# Patient Record
Sex: Male | Born: 1940 | Race: White | Hispanic: No | Smoking: Former smoker
Health system: Southern US, Community
[De-identification: ages and names within clinical notes are randomized; demographics above are authoritative.]

## PROBLEM LIST (undated history)

## (undated) DIAGNOSIS — N189 Chronic kidney disease, unspecified: Secondary | ICD-10-CM

## (undated) DIAGNOSIS — I10 Essential (primary) hypertension: Secondary | ICD-10-CM

## (undated) DIAGNOSIS — F32A Depression, unspecified: Secondary | ICD-10-CM

## (undated) DIAGNOSIS — C187 Malignant neoplasm of sigmoid colon: Secondary | ICD-10-CM

## (undated) DIAGNOSIS — C4491 Basal cell carcinoma of skin, unspecified: Secondary | ICD-10-CM

## (undated) DIAGNOSIS — G4733 Obstructive sleep apnea (adult) (pediatric): Secondary | ICD-10-CM

## (undated) DIAGNOSIS — I509 Heart failure, unspecified: Secondary | ICD-10-CM

## (undated) DIAGNOSIS — J189 Pneumonia, unspecified organism: Secondary | ICD-10-CM

## (undated) DIAGNOSIS — F329 Major depressive disorder, single episode, unspecified: Secondary | ICD-10-CM

## (undated) DIAGNOSIS — J439 Emphysema, unspecified: Secondary | ICD-10-CM

## (undated) DIAGNOSIS — I4891 Unspecified atrial fibrillation: Secondary | ICD-10-CM

## (undated) DIAGNOSIS — I2510 Atherosclerotic heart disease of native coronary artery without angina pectoris: Secondary | ICD-10-CM

## (undated) DIAGNOSIS — I5030 Unspecified diastolic (congestive) heart failure: Secondary | ICD-10-CM

## (undated) DIAGNOSIS — J449 Chronic obstructive pulmonary disease, unspecified: Secondary | ICD-10-CM

## (undated) DIAGNOSIS — M1990 Unspecified osteoarthritis, unspecified site: Secondary | ICD-10-CM

## (undated) DIAGNOSIS — C3490 Malignant neoplasm of unspecified part of unspecified bronchus or lung: Secondary | ICD-10-CM

## (undated) DIAGNOSIS — H539 Unspecified visual disturbance: Secondary | ICD-10-CM

## (undated) HISTORY — DX: Malignant neoplasm of unspecified part of unspecified bronchus or lung: C34.90

## (undated) HISTORY — PX: CATARACT EXTRACTION W/ INTRAOCULAR LENS  IMPLANT, BILATERAL: SHX1307

## (undated) HISTORY — PX: COLONOSCOPY: SHX174

## (undated) HISTORY — DX: Pneumonia, unspecified organism: J18.9

## (undated) HISTORY — PX: REPLACEMENT TOTAL KNEE BILATERAL: SUR1225

## (undated) HISTORY — DX: Obstructive sleep apnea (adult) (pediatric): G47.33

## (undated) HISTORY — DX: Chronic kidney disease, unspecified: N18.9

## (undated) HISTORY — DX: Malignant neoplasm of sigmoid colon: C18.7

## (undated) HISTORY — PX: LUNG CANCER SURGERY: SHX702

## (undated) HISTORY — DX: Essential (primary) hypertension: I10

## (undated) HISTORY — DX: Basal cell carcinoma of skin, unspecified: C44.91

## (undated) HISTORY — DX: Atherosclerotic heart disease of native coronary artery without angina pectoris: I25.10

## (undated) HISTORY — DX: Chronic obstructive pulmonary disease, unspecified: J44.9

## (undated) HISTORY — DX: Depression, unspecified: F32.A

## (undated) HISTORY — DX: Heart failure, unspecified: I50.9

## (undated) HISTORY — DX: Unspecified osteoarthritis, unspecified site: M19.90

## (undated) HISTORY — DX: Unspecified diastolic (congestive) heart failure: I50.30

## (undated) HISTORY — DX: Emphysema, unspecified: J43.9

## (undated) HISTORY — DX: Unspecified atrial fibrillation: I48.91

## (undated) HISTORY — PX: TOTAL HIP ARTHROPLASTY: SHX124

## (undated) HISTORY — DX: Major depressive disorder, single episode, unspecified: F32.9

## (undated) HISTORY — DX: Unspecified visual disturbance: H53.9

## (undated) HISTORY — PX: COLON SURGERY: SHX602

## (undated) HISTORY — PX: OTHER SURGICAL HISTORY: SHX169

---

## 2001-10-20 ENCOUNTER — Inpatient Hospital Stay (HOSPITAL_COMMUNITY): Admission: RE | Admit: 2001-10-20 | Discharge: 2001-10-23 | Payer: Self-pay | Admitting: Orthopedic Surgery

## 2001-10-20 ENCOUNTER — Encounter: Payer: Self-pay | Admitting: Orthopedic Surgery

## 2001-10-21 ENCOUNTER — Encounter: Payer: Self-pay | Admitting: Orthopedic Surgery

## 2002-02-18 ENCOUNTER — Encounter: Payer: Self-pay | Admitting: Orthopedic Surgery

## 2002-02-18 ENCOUNTER — Inpatient Hospital Stay (HOSPITAL_COMMUNITY): Admission: RE | Admit: 2002-02-18 | Discharge: 2002-02-21 | Payer: Self-pay | Admitting: Orthopedic Surgery

## 2006-05-14 DIAGNOSIS — C187 Malignant neoplasm of sigmoid colon: Secondary | ICD-10-CM

## 2006-05-14 DIAGNOSIS — C3490 Malignant neoplasm of unspecified part of unspecified bronchus or lung: Secondary | ICD-10-CM

## 2006-05-14 HISTORY — DX: Malignant neoplasm of sigmoid colon: C18.7

## 2006-05-14 HISTORY — DX: Malignant neoplasm of unspecified part of unspecified bronchus or lung: C34.90

## 2006-05-14 HISTORY — PX: LOW ANTERIOR BOWEL RESECTION: SUR1240

## 2006-05-14 HISTORY — PX: OTHER SURGICAL HISTORY: SHX169

## 2006-07-10 ENCOUNTER — Ambulatory Visit: Payer: Self-pay | Admitting: Gastroenterology

## 2006-07-25 ENCOUNTER — Encounter (INDEPENDENT_AMBULATORY_CARE_PROVIDER_SITE_OTHER): Payer: Self-pay | Admitting: Specialist

## 2006-07-25 ENCOUNTER — Ambulatory Visit: Payer: Self-pay | Admitting: Gastroenterology

## 2006-07-29 ENCOUNTER — Ambulatory Visit: Payer: Self-pay | Admitting: Internal Medicine

## 2006-08-02 ENCOUNTER — Encounter: Admission: RE | Admit: 2006-08-02 | Discharge: 2006-08-02 | Payer: Self-pay | Admitting: Surgery

## 2006-08-12 ENCOUNTER — Inpatient Hospital Stay (HOSPITAL_COMMUNITY): Admission: RE | Admit: 2006-08-12 | Discharge: 2006-08-18 | Payer: Self-pay | Admitting: Surgery

## 2006-08-12 ENCOUNTER — Encounter (INDEPENDENT_AMBULATORY_CARE_PROVIDER_SITE_OTHER): Payer: Self-pay | Admitting: Specialist

## 2006-08-12 ENCOUNTER — Ambulatory Visit: Payer: Self-pay | Admitting: Internal Medicine

## 2006-08-15 ENCOUNTER — Encounter: Payer: Self-pay | Admitting: Internal Medicine

## 2006-09-11 ENCOUNTER — Ambulatory Visit: Payer: Self-pay | Admitting: Internal Medicine

## 2006-10-14 ENCOUNTER — Ambulatory Visit: Payer: Self-pay

## 2007-01-14 ENCOUNTER — Encounter: Admission: RE | Admit: 2007-01-14 | Discharge: 2007-01-14 | Payer: Self-pay | Admitting: Surgery

## 2007-01-29 ENCOUNTER — Ambulatory Visit: Payer: Self-pay | Admitting: Thoracic Surgery

## 2007-02-04 ENCOUNTER — Ambulatory Visit (HOSPITAL_COMMUNITY): Admission: RE | Admit: 2007-02-04 | Discharge: 2007-02-04 | Payer: Self-pay | Admitting: Thoracic Surgery

## 2007-02-06 ENCOUNTER — Ambulatory Visit: Payer: Self-pay | Admitting: Thoracic Surgery

## 2007-02-11 ENCOUNTER — Ambulatory Visit: Admission: RE | Admit: 2007-02-11 | Discharge: 2007-05-12 | Payer: Self-pay | Admitting: Radiation Oncology

## 2007-02-25 ENCOUNTER — Ambulatory Visit (HOSPITAL_COMMUNITY): Admission: RE | Admit: 2007-02-25 | Discharge: 2007-02-25 | Payer: Self-pay | Admitting: Thoracic Surgery

## 2007-03-11 ENCOUNTER — Inpatient Hospital Stay (HOSPITAL_COMMUNITY): Admission: RE | Admit: 2007-03-11 | Discharge: 2007-03-16 | Payer: Self-pay | Admitting: Thoracic Surgery

## 2007-03-11 ENCOUNTER — Encounter: Payer: Self-pay | Admitting: Thoracic Surgery

## 2007-03-11 ENCOUNTER — Ambulatory Visit: Payer: Self-pay | Admitting: Thoracic Surgery

## 2007-03-26 ENCOUNTER — Ambulatory Visit: Payer: Self-pay | Admitting: Thoracic Surgery

## 2007-03-26 ENCOUNTER — Encounter: Admission: RE | Admit: 2007-03-26 | Discharge: 2007-03-26 | Payer: Self-pay | Admitting: Thoracic Surgery

## 2007-04-16 ENCOUNTER — Ambulatory Visit: Payer: Self-pay | Admitting: Thoracic Surgery

## 2007-04-16 ENCOUNTER — Encounter: Admission: RE | Admit: 2007-04-16 | Discharge: 2007-04-16 | Payer: Self-pay | Admitting: Thoracic Surgery

## 2007-05-30 ENCOUNTER — Ambulatory Visit: Admission: RE | Admit: 2007-05-30 | Discharge: 2007-08-28 | Payer: Self-pay | Admitting: Radiation Oncology

## 2007-06-18 ENCOUNTER — Ambulatory Visit: Payer: Self-pay | Admitting: Thoracic Surgery

## 2007-06-18 ENCOUNTER — Encounter: Admission: RE | Admit: 2007-06-18 | Discharge: 2007-06-18 | Payer: Self-pay | Admitting: Thoracic Surgery

## 2007-09-05 ENCOUNTER — Ambulatory Visit: Payer: Self-pay | Admitting: Gastroenterology

## 2007-09-17 ENCOUNTER — Ambulatory Visit: Payer: Self-pay | Admitting: Thoracic Surgery

## 2007-09-17 ENCOUNTER — Encounter: Admission: RE | Admit: 2007-09-17 | Discharge: 2007-09-17 | Payer: Self-pay | Admitting: Thoracic Surgery

## 2007-09-19 ENCOUNTER — Ambulatory Visit: Payer: Self-pay | Admitting: Gastroenterology

## 2007-09-19 ENCOUNTER — Encounter: Payer: Self-pay | Admitting: Gastroenterology

## 2007-09-22 ENCOUNTER — Encounter: Payer: Self-pay | Admitting: Gastroenterology

## 2008-03-24 ENCOUNTER — Ambulatory Visit: Payer: Self-pay | Admitting: Thoracic Surgery

## 2008-03-24 ENCOUNTER — Encounter: Admission: RE | Admit: 2008-03-24 | Discharge: 2008-03-24 | Payer: Self-pay | Admitting: Thoracic Surgery

## 2008-10-13 ENCOUNTER — Encounter: Admission: RE | Admit: 2008-10-13 | Discharge: 2008-10-13 | Payer: Self-pay | Admitting: Thoracic Surgery

## 2008-10-13 ENCOUNTER — Ambulatory Visit: Payer: Self-pay | Admitting: Thoracic Surgery

## 2009-03-22 ENCOUNTER — Encounter: Payer: Self-pay | Admitting: Cardiology

## 2009-04-01 DIAGNOSIS — I10 Essential (primary) hypertension: Secondary | ICD-10-CM | POA: Insufficient documentation

## 2009-04-01 DIAGNOSIS — Z8679 Personal history of other diseases of the circulatory system: Secondary | ICD-10-CM

## 2009-04-01 DIAGNOSIS — M1990 Unspecified osteoarthritis, unspecified site: Secondary | ICD-10-CM | POA: Insufficient documentation

## 2009-04-01 DIAGNOSIS — C189 Malignant neoplasm of colon, unspecified: Secondary | ICD-10-CM | POA: Insufficient documentation

## 2009-04-04 ENCOUNTER — Ambulatory Visit: Payer: Self-pay | Admitting: Internal Medicine

## 2009-04-04 DIAGNOSIS — IMO0001 Reserved for inherently not codable concepts without codable children: Secondary | ICD-10-CM

## 2009-04-04 DIAGNOSIS — Z85118 Personal history of other malignant neoplasm of bronchus and lung: Secondary | ICD-10-CM

## 2009-04-05 ENCOUNTER — Ambulatory Visit: Payer: Self-pay | Admitting: Cardiovascular Disease

## 2009-04-05 ENCOUNTER — Encounter: Payer: Self-pay | Admitting: Internal Medicine

## 2009-04-27 ENCOUNTER — Ambulatory Visit: Payer: Self-pay | Admitting: Thoracic Surgery

## 2009-04-27 ENCOUNTER — Encounter: Admission: RE | Admit: 2009-04-27 | Discharge: 2009-04-27 | Payer: Self-pay | Admitting: Thoracic Surgery

## 2009-05-03 ENCOUNTER — Telehealth: Payer: Self-pay | Admitting: Internal Medicine

## 2009-05-04 ENCOUNTER — Encounter: Payer: Self-pay | Admitting: Cardiology

## 2009-05-19 ENCOUNTER — Inpatient Hospital Stay (HOSPITAL_COMMUNITY): Admission: RE | Admit: 2009-05-19 | Discharge: 2009-05-25 | Payer: Self-pay | Admitting: Orthopedic Surgery

## 2009-09-16 ENCOUNTER — Encounter: Payer: Self-pay | Admitting: Cardiology

## 2009-09-20 ENCOUNTER — Ambulatory Visit: Payer: Self-pay | Admitting: Cardiology

## 2009-09-20 DIAGNOSIS — R0989 Other specified symptoms and signs involving the circulatory and respiratory systems: Secondary | ICD-10-CM

## 2009-09-20 DIAGNOSIS — R0609 Other forms of dyspnea: Secondary | ICD-10-CM

## 2009-09-20 DIAGNOSIS — I5032 Chronic diastolic (congestive) heart failure: Secondary | ICD-10-CM

## 2009-09-20 DIAGNOSIS — I2510 Atherosclerotic heart disease of native coronary artery without angina pectoris: Secondary | ICD-10-CM

## 2009-09-21 LAB — CONVERTED CEMR LAB: Pro B Natriuretic peptide (BNP): 281 pg/mL — ABNORMAL HIGH (ref 0.0–100.0)

## 2009-10-04 ENCOUNTER — Ambulatory Visit: Payer: Self-pay | Admitting: Cardiology

## 2009-10-05 LAB — CONVERTED CEMR LAB
CO2: 27 meq/L (ref 19–32)
Calcium: 9.1 mg/dL (ref 8.4–10.5)
Chloride: 101 meq/L (ref 96–112)
GFR calc non Af Amer: 49.51 mL/min (ref >60)
Potassium: 4.2 meq/L (ref 3.5–5.1)

## 2009-10-06 ENCOUNTER — Telehealth (INDEPENDENT_AMBULATORY_CARE_PROVIDER_SITE_OTHER): Payer: Self-pay | Admitting: *Deleted

## 2009-10-11 ENCOUNTER — Ambulatory Visit: Payer: Self-pay | Admitting: Cardiology

## 2009-10-11 ENCOUNTER — Ambulatory Visit (HOSPITAL_COMMUNITY): Admission: RE | Admit: 2009-10-11 | Discharge: 2009-10-11 | Payer: Self-pay | Admitting: Cardiology

## 2009-10-11 ENCOUNTER — Ambulatory Visit: Payer: Self-pay

## 2009-10-11 ENCOUNTER — Encounter (HOSPITAL_COMMUNITY): Admission: RE | Admit: 2009-10-11 | Discharge: 2009-12-14 | Payer: Self-pay | Admitting: Cardiology

## 2009-10-11 ENCOUNTER — Encounter: Payer: Self-pay | Admitting: Cardiology

## 2009-10-12 DIAGNOSIS — J189 Pneumonia, unspecified organism: Secondary | ICD-10-CM

## 2009-10-12 HISTORY — DX: Pneumonia, unspecified organism: J18.9

## 2009-10-14 ENCOUNTER — Telehealth: Payer: Self-pay | Admitting: Cardiology

## 2009-10-17 ENCOUNTER — Ambulatory Visit: Payer: Self-pay | Admitting: Diagnostic Radiology

## 2009-10-17 ENCOUNTER — Inpatient Hospital Stay (HOSPITAL_COMMUNITY): Admission: EM | Admit: 2009-10-17 | Discharge: 2009-10-24 | Payer: Self-pay | Admitting: Internal Medicine

## 2009-10-17 ENCOUNTER — Ambulatory Visit: Payer: Self-pay | Admitting: Cardiology

## 2009-10-17 ENCOUNTER — Encounter: Payer: Self-pay | Admitting: Emergency Medicine

## 2009-10-21 ENCOUNTER — Encounter: Payer: Self-pay | Admitting: Cardiology

## 2009-10-22 ENCOUNTER — Encounter: Payer: Self-pay | Admitting: Cardiovascular Disease

## 2009-10-26 ENCOUNTER — Ambulatory Visit: Payer: Self-pay | Admitting: Thoracic Surgery

## 2009-11-02 ENCOUNTER — Ambulatory Visit: Payer: Self-pay | Admitting: Cardiology

## 2009-11-03 ENCOUNTER — Encounter: Payer: Self-pay | Admitting: Cardiology

## 2009-11-03 LAB — CONVERTED CEMR LAB
BUN: 25 mg/dL — ABNORMAL HIGH (ref 6–23)
Calcium: 8.8 mg/dL (ref 8.4–10.5)
Chloride: 105 meq/L (ref 96–112)
Glucose, Bld: 157 mg/dL — ABNORMAL HIGH (ref 70–99)
Pro B Natriuretic peptide (BNP): 259.4 pg/mL — ABNORMAL HIGH (ref 0.0–100.0)
Sodium: 143 meq/L (ref 135–145)

## 2009-11-21 ENCOUNTER — Ambulatory Visit: Payer: Self-pay | Admitting: Cardiology

## 2009-11-24 ENCOUNTER — Ambulatory Visit: Payer: Self-pay | Admitting: Internal Medicine

## 2009-11-25 ENCOUNTER — Encounter: Payer: Self-pay | Admitting: Cardiology

## 2009-11-28 ENCOUNTER — Ambulatory Visit: Payer: Self-pay | Admitting: Cardiology

## 2009-12-01 ENCOUNTER — Ambulatory Visit: Payer: Self-pay | Admitting: Cardiology

## 2009-12-01 ENCOUNTER — Inpatient Hospital Stay (HOSPITAL_BASED_OUTPATIENT_CLINIC_OR_DEPARTMENT_OTHER): Admission: RE | Admit: 2009-12-01 | Discharge: 2009-12-01 | Payer: Self-pay | Admitting: Cardiology

## 2009-12-01 LAB — CONVERTED CEMR LAB
AST: 19 units/L (ref 0–37)
Alkaline Phosphatase: 105 units/L (ref 39–117)
BUN: 20 mg/dL (ref 6–23)
CO2: 32 meq/L (ref 19–32)
Calcium: 9.3 mg/dL (ref 8.4–10.5)
Chloride: 106 meq/L (ref 96–112)
Cholesterol: 200 mg/dL (ref 0–200)
Creatinine, Ser: 1.4 mg/dL (ref 0.4–1.5)
Eosinophils Relative: 1.5 % (ref 0.0–5.0)
GFR calc non Af Amer: 53.59 mL/min (ref >60)
Glucose, Bld: 124 mg/dL — ABNORMAL HIGH (ref 70–99)
INR: 1 (ref 0.8–1.0)
LDL Cholesterol: 142 mg/dL — ABNORMAL HIGH (ref 0–99)
Lymphocytes Relative: 21.3 % (ref 12.0–46.0)
MCV: 90.5 fL (ref 78.0–100.0)
Monocytes Relative: 9.5 % (ref 3.0–12.0)
Neutrophils Relative %: 67.2 % (ref 43.0–77.0)
Potassium: 4.7 meq/L (ref 3.5–5.1)
RBC: 4.5 M/uL (ref 4.22–5.81)
RDW: 15.5 % — ABNORMAL HIGH (ref 11.5–14.6)
Sodium: 142 meq/L (ref 135–145)
Total CHOL/HDL Ratio: 5

## 2010-01-05 ENCOUNTER — Ambulatory Visit: Payer: Self-pay | Admitting: Cardiology

## 2010-01-05 ENCOUNTER — Telehealth (INDEPENDENT_AMBULATORY_CARE_PROVIDER_SITE_OTHER): Payer: Self-pay | Admitting: *Deleted

## 2010-02-09 ENCOUNTER — Ambulatory Visit: Payer: Self-pay | Admitting: Pulmonary Disease

## 2010-04-18 ENCOUNTER — Ambulatory Visit: Payer: Self-pay | Admitting: Cardiology

## 2010-04-21 ENCOUNTER — Ambulatory Visit: Payer: Self-pay | Admitting: Pulmonary Disease

## 2010-04-24 ENCOUNTER — Ambulatory Visit: Payer: Self-pay | Admitting: Pulmonary Disease

## 2010-04-24 DIAGNOSIS — G4733 Obstructive sleep apnea (adult) (pediatric): Secondary | ICD-10-CM | POA: Insufficient documentation

## 2010-04-25 ENCOUNTER — Encounter
Admission: RE | Admit: 2010-04-25 | Discharge: 2010-04-25 | Payer: Self-pay | Source: Home / Self Care | Attending: Thoracic Surgery | Admitting: Thoracic Surgery

## 2010-04-25 ENCOUNTER — Ambulatory Visit: Payer: Self-pay | Admitting: Thoracic Surgery

## 2010-06-04 ENCOUNTER — Encounter: Payer: Self-pay | Admitting: Thoracic Surgery

## 2010-06-13 NOTE — Progress Notes (Signed)
Summary: Test results   Phone Note Call from Patient Call back at [phone]   Caller: Patient Summary of Call: Test results Initial call taken by: Judie Grieve,  October 14, 2009 1:31 PM  Follow-up for Phone Call        talked with pt by telephone about echo and myoview 10/11/09

## 2010-06-13 NOTE — Progress Notes (Signed)
Summary: switching doctors  Phone Note Call from Patient Call back at [facility] Phone [phone]   Caller: Patient Call For: wert Reason for Call: Talk to Nurse Summary of Call: pt would like to switch his pulmonary doctor to [facility] from [place].  Hsays he has seen both doctors although I only she Wert.  Dr. Shirlee Latch from Cardiology is referring pt over for COPD. Initial call taken by: Eugene Gavia,  January 05, 2010 11:32 AM  Follow-up for Phone Call        pt last saw [facility] 04/04/2009.  Pt wanting to switch from [facility] to [facility].  Will forward this message to both Dr's to address.  Aundra Millet Reynolds LPN  January 05, 2010 11:41 AM  He probably saw Nyssa in hospital, I only saw him once for preop eval so I'm fine with him going back to [place] - I don't recall any issues  Follow-up by: Nyoka Cowden MD,  January 05, 2010 2:09 PM  Additional Follow-up for Phone Call Additional follow up Details #1::        Spoke with pt and sched consult with Dr Craige Cotta for 02/02/10 at 2:45 pm. Additional Follow-up by: Vernie Murders,  January 05, 2010 2:16 PM

## 2010-06-13 NOTE — Consult Note (Signed)
Summary: [facility]   Imported By: Roderic Ovens 11/04/2009 15:17:41  _____________________________________________________________________  External Attachment:    Type:   Image     Comment:   External Document

## 2010-06-13 NOTE — Miscellaneous (Signed)
Summary: Orders Update pft charges  Clinical Lists Changes  Orders: Added new Service order of Carbon Monoxide diffusing w/capacity (94720) - Signed Added new Service order of Lung Volumes (94240) - Signed Added new Service order of Spirometry (Pre & Post) (94060) - Signed 

## 2010-06-13 NOTE — Assessment & Plan Note (Signed)
Summary: rov/sl      Allergies Added: NKDA  Visit Type:  Follow-up Referring Provider:  Dr Herb Grays Primary Provider:  Dr. Herb Grays  CC:  SOB.  History of Present Illness: 70 yo with history of paroxysmal atrial fibrillation, diastolic CHF, COPD, lung and colon cancers, and HTN returns for evaluation of dyspnea.  Patient had revision left TKR in 1/11.  He had post-op volume overload/pulmonary edema resolved with Lasix.  He has had significant chronic shortness of breath that has been present for at least a year.  At last appointment, he was short of breath after walking about 50 yards or pushing his garbage to the road.  He had atypical chest pain.  Patient was started on Lasix and Spiriva.   I set him up for myoview and echo.  Lexiscan myoview showed EF 45% with lateral hypokinesis and apical thinning but no definite ischemia or infarction.  Echo showed EF 55% with mild LV hypertrophy, mild diastolic dysfunction, and normal RV size and function.    After this, patient was admitted to the hospital with fever/chills/cough and was found to have right upper lobe PNA.  In the hospital, he developed atrial fibrillation with rapid ventricular response.  He actually converted back to NSR while on a diltiazem gtt.  He was started on amiodarone and dabigatran and discharged home.  Troponin was noted to be mildly elevated (0.11) in the setting of afib/RVR.  Given history of significant exertional dyspnea, it was thought that he should get a left and right heart cath after recovery from PNA. Patient is in sinus rhythm today.  He continues to have exertional dyspnea (pushing shopping carts at work at [employer] and climbing a flight of steps.  No chest pain.  No more orthostatic symptoms.   ECG: NSR,  old inferior MI  Labs (1/11): K 3.6, creatinine 1.4, TSH normal, BNP 419 Labs (5/11): BNP 281=>132, K 4.2, creatinine 1.4 Labs (6/11): K 4.9, creatinine 1.4, BNP 259, maximal troponin in hospital  0.11  Current Medications (verified): 1)  Meclizine Hcl 25 Mg Tabs (Meclizine Hcl) .... Take 1 Tablet By Mouth Once A Day 2)  Paxil 40 Mg Tabs (Paroxetine Hcl) .... Take 1 Tablet By Mouth Once A Day 3)  Aricept 10 Mg Tabs (Donepezil Hcl) .... Take 1 Tablet By Mouth Once A Day 4)  Depo-Testosterone 200 Mg/ml Oil (Testosterone Cypionate) .... Every 2 Weeks 5)  Furosemide 20 Mg Tabs (Furosemide) .... One Tablet Daily 6)  Klor-Con 10 10 Meq Cr-Tabs (Potassium Chloride) .... One Tablet Daily 7)  Spiriva Handihaler 18 Mcg Caps (Tiotropium Bromide Monohydrate) .... Use Daily As Directed 8)  Proair Hfa 108 (90 Base) Mcg/act Aers (Albuterol Sulfate) .... Use As Directed 9)  Aspirin 81 Mg Tbec (Aspirin) .... Take One Tablet By Mouth Daily 10)  Pradaxa 150 Mg Caps (Dabigatran Etexilate Mesylate) .... Take 1 By Mouth Two Times A Day 11)  Omeprazole 20 Mg Cpdr (Omeprazole) .... Take 1 By Mouth Once Daily 12)  Coreg 12.5 Mg Tabs (Carvedilol) .... One Tablet Twice A Day 13)  Amiodarone Hcl 200 Mg Tabs (Amiodarone Hcl) .... Take One Tablet By Mouth Daily 14)  Hydrocodone-Acetaminophen 5-325 Mg Tabs (Hydrocodone-Acetaminophen) .... Take As Directed As Needed 15)  Diltiazem Hcl Cr 180 Mg Xr24h-Cap (Diltiazem Hcl) .Marland Kitchen.. 1 Once Daily  Allergies (verified): No Known Drug Allergies  Past History:  Past Medical History: Reviewed history from 11/02/2009 and no changes required. 1. CARCINOMA, LUNG, HX OF (ICD-V10.11)     -  LUL wedge resection and seed implant 02/2007     - CT neg recurrence 10/13/08 2. COLON CANCER (ICD-153.9): s/p resection in 2008.  3. OSTEOARTHRITIS (ICD-715.90)     - s/p Bilat. Hip and Knee replacements with redo R Knee replacement and redo left TKR. 4. HYPERTENSION (ICD-401.9) 5. ATRIAL FIBRILLATION, HX OF (ICD-V12.59): post-operative in 2008.  Had another episode in 6/11 in the setting of PNA.  He was started on dabigatran and on amiodarone to maintain NSR.  6. Adenosine myoview (6/08):  EF 56%, fixed inferior defect may have been attenuation.  No evidence for ischemia.  Lexiscan myoview (5/11): EF 45%, lateral hypokinesis, apical thinning with no evidence for ischemia or infarction.   7. COPD    - PFT's April 04, 2009  FEV1 33    and ratio 54    - HFA 50% April 04, 2009  8. CKD 9. ? Dementia (he is on Aricept) 10. CHF: Diastolic, echo (8/08) with EF 16%, moderate LVH.  Echo (5/11): EF 55%, mild LV hypertrophy, normal wall motion, mild diastolic dysfunction, mild MR, mild LAE, normal RV size and systolic function.   11.  RIght upper lobe PNA 6/11  Family History: Reviewed history from 04/04/2009 and no changes required. Colon CA- Sister and Father  Social History: Married, lives in [place] Former smoker.  Quit in 2008.  Smoked approx 40 yrs up to 3 ppd. Quit ETOH 2008 Working at [employer]  Review of Systems       All systems reviewed and negative except as per HPI.   Vital Signs:  Patient profile:   70 year old male Height:      72 inches Weight:      226 pounds BMI:     30.76 Pulse rate:   77 / minute Pulse rhythm:   regular BP sitting:   142 / 84  (left arm) Cuff size:   regular  Vitals Entered By: Hardin Negus, RMA (November 21, 2009 8:35 AM)  Physical Exam  General:  Well developed, well nourished, in no acute distress.  Obese.  Neck:  Neck supple, JVP 8 cm. No masses, thyromegaly or abnormal cervical nodes. Lungs:  Mildly decreased breath sounds bilaterally.  Heart:  Non-displaced PMI, chest non-tender; regular rate and rhythm, S1, S2 without murmurs, rubs or gallops. Carotid upstroke normal, no bruit.  Pedals normal pulses. Trace ankle edema.  Abdomen:  Bowel sounds positive; abdomen soft and non-tender without masses, organomegaly, or hernias noted. No hepatosplenomegaly. Extremities:  No clubbing or cyanosis. Neurologic:  Alert and oriented x 3. Psych:  Normal affect.   Impression & Recommendations:  Problem # 1:  CAD,  NATIVE VESSEL (ICD-414.01) Patient has significant exertional dyspnea probably due to a combination of diastolic CHF and COPD as well as atypical chest pain.  Dyspnea has improved somewhat with diuresis.  Troponin was elevated with atrial fibrillation/RVR while in the hospital.  Myoview showed decreased EF but no definite ischemia or infarction.  Plan at discharge from hospital was left and right heart cath after he has recovered from PNA.  I will have him stop Pradaxa week (has been on it 1 month post-atrial fibrillation episode) and will do a left and right heart cath when he has been off the medication for 3 full days.  Will check fasting lipids with labs this week.   Problem # 2:  DYSPNEA ON EXERTION (ICD-786.09) Likely a mixed picture of diastolic CHF (? ischemic) and COPD.  He is breathing  better with Lasix and Spiriva.  He may be mildly overloaded on exam. Continue current Lasix.  Check BMET.  Will be getting right heart cath.   Problem # 3:  ATRIAL FIBRILLATION, HX OF (ICD-V12.59) Paroxysmal. Needs anticoagulation because of relatively high stroke risk.  He is now on amiodarone for maintenance  of NSR.  - Check TSH, LFTs, PFTs on amiodarone.  Will need yearly eye exam.  - Stop dabigatran for cath (3 full days prior), resume after cath.    Other Orders: EKG w/ Interpretation (93000) Cardiac Catheterization (Cardiac Cath) Pulmonary Function Test (PFT)  Patient Instructions: 1)  Your physician recommends that you return for a FASTING lipid profile/liver profile/CBC/BMP/INR/TSH --427.31 786.09--Monday July 18,2011 2)  Your physician has requested that you have a cardiac catheterization.  Cardiac catheterization is used to diagnose and/or treat various heart conditions. Doctors may recommend this procedure for a number of different reasons. The most common reason is to evaluate chest pain. Chest pain can be a symptom of coronary artery disease (CAD), and cardiac catheterization can show whether  plaque is narrowing or blocking your heart's arteries. This procedure is also used to evaluate the valves, as well as measure the blood flow and oxygen levels in different parts of your heart.  For further information please visit [email or web address]  Please follow instruction sheet, as given. 3)  Thursday July 21,2011 4)  Your physician has recommended that you have a pulmonary function test.  Pulmonary Function Tests are a group of tests that measure how well air moves in and out of your lungs. 5)  Your physician recommends that you schedule a follow-up appointment in: 3 weeks after the cath 12/01/09 with Dr Shirlee Latch.

## 2010-06-13 NOTE — Assessment & Plan Note (Addendum)
Summary: copd/former mw pt//lmr   Copy to:  Marca Ancona, Karle Plumber Primary Provider/Referring Provider:  Dr. Herb Grays  CC:  Patient is here for pulmonary evaluation...former Dr. Sherene Sires patient...the patient c/o increased sob with exertion...cough with occasional mucus production.  History of Present Illness: 70 yo male with GOLD 2 COPD.  He was previously seen by Dr. Sherene Sires.  He needs to re-establish pulmonary care.  He was hospitalized in June for right upper lobe pneumonia, but has improved from this.  He still has some trouble with his breathing.  He has been getting occasional shortness of breath.  He has a cough with clear sputum.  He denies hemoptysis, or recent fever.  He has been using spiriva and symbicort.  He was on advair before, felt that this worked better.  He has not been using his inhalers on a consistent basis.  He has trouble walking, but usually has to stop because of joint pains rather than because of his breathing.  He also feels like he lacks the energy to do things, and some of this may be related to feeling depressed.  He will initially feel like doing activities only to realize that he can't, and this causes frustration.   Preventive Screening-Counseling & Management  Alcohol-Tobacco     Alcohol drinks/day: 0     Smoking Status: quit     Packs/Day: 1.5     Year Started: 1950?     Year Quit: 1998  Current Medications (verified): 1)  Spiriva Handihaler 18 Mcg Caps (Tiotropium Bromide Monohydrate) .... Use Daily As Directed 2)  Proair Hfa 108 (90 Base) Mcg/act Aers (Albuterol Sulfate) .... Use As Directed 3)  Meclizine Hcl 25 Mg Tabs (Meclizine Hcl) .... Take 1 Tablet By Mouth Once A Day 4)  Aricept 10 Mg Tabs (Donepezil Hcl) .... Take 1 Tablet By Mouth Once A Day 5)  Depo-Testosterone 200 Mg/ml Oil (Testosterone Cypionate) .... Every 2 Weeks 6)  Furosemide 20 Mg Tabs (Furosemide) .... One Tablet Daily 7)  Klor-Con 10 10 Meq Cr-Tabs (Potassium Chloride)  .... One Tablet Daily 8)  Aspirin 81 Mg Tbec (Aspirin) .... Take One Tablet By Mouth Daily 9)  Pradaxa 150 Mg Caps (Dabigatran Etexilate Mesylate) .... Take 1 By Mouth Two Times A Day 10)  Omeprazole 20 Mg Cpdr (Omeprazole) .... Take 1 By Mouth Once Daily 11)  Bisoprolol Fumarate 5 Mg Tabs (Bisoprolol Fumarate) .... One Tablet Daily 12)  Amiodarone Hcl 200 Mg Tabs (Amiodarone Hcl) .... Take One Tablet By Mouth Daily 13)  Hydrocodone-Acetaminophen 5-325 Mg Tabs (Hydrocodone-Acetaminophen) .... Take As Directed As Needed 14)  Diltiazem Hcl Cr 180 Mg Xr24h-Cap (Diltiazem Hcl) .Marland Kitchen.. 1 Once Daily 15)  Zocor 20 Mg Tabs (Simvastatin) .... One in The Evening 16)  Fluoxetine Hcl 20 Mg Caps (Fluoxetine Hcl) .Marland Kitchen.. 1 By Mouth Daily 17)  Symbicort 160-4.5 Mcg/act Aero (Budesonide-Formoterol Fumarate) .... 2 Puffs Two Times A Day As Needed  Allergies (verified): No Known Drug Allergies  Past History:  Past Medical History: 1. 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 COLON CANCER (ICD-153.9): s/p resection in 2008.  3. OSTEOARTHRITIS (ICD-715.90)     - s/p Bilat. Hip and Knee replacements with redo R Knee replacement and redo left TKR. 4. HYPERTENSION (ICD-401.9) 5. ATRIAL FIBRILLATION, HX OF (ICD-V12.59): post-operative in 2008.  Had another episode in 6/11 in the setting of PNA.  He  was started on dabigatran and on amiodarone to maintain NSR.  6. CAD: Adenosine myoview (6/08): EF 56%, fixed inferior defect may have been attenuation.  No evidence for ischemia.  Lexiscan myoview (5/11): EF 45%, lateral hypokinesis, apical thinning with no evidence for ischemia or infarction.  Left heart cath (7/11) with EF 50%, mild global hypokinesis, 50% mid RCA, 40% PLV, 50% ostial ramus.  Moderate nonobstructive disease, medical management.  7. COPD: moderate    - PFT's 04/04/09: FEV1 33%, FEV1% 54    - PFTs 07/11: FEV1 50%, FEV1% 54, TLC  80%, DLCO 69% 8. CKD 9. ? Dementia (he is on Aricept) 10. CHF: Diastolic, echo (8/08) with EF 04%, moderate LVH.  Echo (5/11): EF 55%, mild LV hypertrophy, normal wall motion, mild diastolic dysfunction, mild MR, mild LAE, normal RV size and systolic function.  RHC (7/11): mean RA 7, PA 29/15, PCWP 8.  11.  RIght upper lobe PNA 6/11  Past Surgical History: Left upper lobe wedge resection 2008 Low anterior resection of sigmoid and proximal rectum 2008 Bilateral hip replacement  Bilateral knee replacement  Family History: Reviewed history from 04/04/2009 and no changes required. Colon CA- Sister and Father Family History Asthma---sister Family History Diabetes---2 sisters  Social History: Reviewed history from 11/21/2009 and no changes required. Married, lives in [place] Former smoker.  Quit in 2008.  Smoked approx 40 yrs up to 3 ppd. Quit ETOH 2008 Working at [employer] drinks/day:  0 Smoking Status:  quit Packs/Day:  1.5  Review of Systems       The patient complains of shortness of breath with activity.  The patient denies shortness of breath at rest, productive cough, non-productive cough, coughing up blood, chest pain, irregular heartbeats, acid heartburn, indigestion, loss of appetite, weight change, abdominal pain, difficulty swallowing, sore throat, tooth/dental problems, headaches, nasal congestion/difficulty breathing through nose, sneezing, itching, ear ache, anxiety, depression, hand/feet swelling, joint stiffness or pain, rash, change in color of mucus, and fever.    Vital Signs:  Patient profile:   70 year old male Height:      72 inches (182.88 cm) Weight:      233 pounds (105.91 kg) BMI:     31.71 O2 Sat:      96 % on Room air Temp:     98.3 degrees F (36.83 degrees C) oral Pulse rate:   74 / minute BP sitting:   108 / 68  (left arm) Cuff size:   regular  Vitals Entered By: Michel Bickers CMA (February 09, 2010 4:33 PM)  O2 Sat at Rest %:   96 O2 Flow:  Room air  Serial Vital Signs/Assessments:  Comments: Ambulatory Pulse Oximetry  Resting; HR__74___    02 Sat__96%RA___  Lap1 (185 feet)   HR__84___   02 Sat__97%RA___ Lap2 (185 feet)   HR_  85___   02 Sat__96%RA___    Lap3 (185 feet)   HR_____   02 Sat_____  ___Test Completed without Difficulty _x__Test Stopped due to: knee pain. Zackery Barefoot CMA  February 09, 2010 4:53 PM    By: Zackery Barefoot CMA   CC: Patient is here for pulmonary evaluation...former Dr. Sherene Sires patient...the patient c/o increased sob with exertion...cough with occasional mucus production Comments Medications reviewed with patient Michel Bickers [facility]  February 09, 2010 4:34 PM   Physical Exam  General:  obese.   Eyes:  PERRLA and EOMI.   Nose:  no deformity, discharge, inflammation, or lesions Mouth:  MP 3, no exudate  Neck:  no JVD.   Chest Wall:  no deformities noted Lungs:  diminished breath sounds, no wheezing or rales, no dullness Heart:  regular rhythm, normal rate, and no murmurs.   Abdomen:  obese, soft, nontender, normal bowel sounds Extremities:  trace leg edema, no cyanosis or clubbing Neurologic:  normal CN II-XII and strength normal.   Cervical Nodes:  no significant adenopathy Psych:  alert and cooperative; normal mood and affect; normal attention span and concentration   Impression & Recommendations:  Problem # 1:  COPD UNSPECIFIED (ICD-496) He has an extensive prior history of smoking.  He has evidence for airflow obstruction on pulmonary function testing.    I will adjust his inhaler regimen.  I will resume advair, and stop symbicort.  He is to continue spiriva and as needed albuterol.  Advised him to get his annual flu shot.  He would likely benefit from pulmonary rehab, but I am concerned that his arthritis may be a significant limiting factor at present with his exercise tolerance.  Will arrange for overnight oximetry.  Depending on the results he may need to use  supplemental oxygen at night, or he may need additional testing for sleep disordered breathing.  Of note is that he is on amiodarone, and I have recommended that he get annual pulmonary function testing while he is on this medication.  In addition, he may be experiencing some degree of depression.  I will defer to his primary physician for further assessment of this.  Medications Added to Medication List This Visit: 1)  Symbicort 160-4.5 Mcg/act Aero (Budesonide-formoterol fumarate) .... 2 puffs two times a day as needed 2)  Advair Diskus 250-50 Mcg/dose Aepb (Fluticasone-salmeterol) .... One puff two times a day  Complete Medication List: 1)  Spiriva Handihaler 18 Mcg Caps (Tiotropium bromide monohydrate) .... Use daily as directed 2)  Advair Diskus 250-50 Mcg/dose Aepb (Fluticasone-salmeterol) .... One puff two times a day 3)  Proair Hfa 108 (90 Base) Mcg/act Aers (Albuterol sulfate) .... Use as directed 4)  Meclizine Hcl 25 Mg Tabs (Meclizine hcl) .... Take 1 tablet by mouth once a day 5)  Aricept 10 Mg Tabs (Donepezil hcl) .... Take 1 tablet by mouth once a day 6)  Depo-testosterone 200 Mg/ml Oil (Testosterone cypionate) .... Every 2 weeks 7)  Furosemide 20 Mg Tabs (Furosemide) .... One tablet daily 8)  Klor-con 10 10 Meq Cr-tabs (Potassium chloride) .... One tablet daily 9)  Aspirin 81 Mg Tbec (Aspirin) .... Take one tablet by mouth daily 10)  Pradaxa 150 Mg Caps (Dabigatran etexilate mesylate) .... Take 1 by mouth two times a day 11)  Omeprazole 20 Mg Cpdr (Omeprazole) .... Take 1 by mouth once daily 12)  Bisoprolol Fumarate 5 Mg Tabs (Bisoprolol fumarate) .... One tablet daily 13)  Amiodarone Hcl 200 Mg Tabs (Amiodarone hcl) .... Take one tablet by mouth daily 14)  Hydrocodone-acetaminophen 5-325 Mg Tabs (Hydrocodone-acetaminophen) .... Take as directed as needed 15)  Diltiazem Hcl Cr 180 Mg Xr24h-cap (Diltiazem hcl) .Marland Kitchen.. 1 once daily 16)  Zocor 20 Mg Tabs (Simvastatin) .... One in  the evening 17)  Fluoxetine Hcl 20 Mg Caps (Fluoxetine hcl) .Marland Kitchen.. 1 by mouth daily  Other Orders: Est. Patient Level IV (72536) DME Referral (DME)  Patient Instructions: 1)  Stop symbicort 2)  Spiriva one puff once daily 3)  Advair one puff two times a day 4)  Proair two puffs up to four times per day as needed  5)  Will schedule oxygen test  at night 6)  Follow up in 2 to 3 months Prescriptions: PROAIR HFA 108 (90 BASE) MCG/ACT AERS (ALBUTEROL SULFATE) Use as directed  #1 x 6   Entered and Authorized by:   Coralyn Helling MD   Signed by:   Coralyn Helling MD on 02/09/2010   Method used:   Electronically to        [facility]  [address] [phone][facility] (retail)       [address] [place], [place]  [address]       Ph: [phone] or [phone]       Fax: [phone]   RxID:   [id number] SPIRIVA HANDIHALER 18 MCG CAPS (TIOTROPIUM BROMIDE MONOHYDRATE) use daily as directed  #1 x 6   Entered and Authorized by:   Coralyn Helling MD   Signed by:   Coralyn Helling MD on 02/09/2010   Method used:   Electronically to        [facility]  [address] [phone][facility] (retail)       [address] [place], [place]  [address]       Ph: [phone] or [phone]       Fax: [phone]   RxID:   [id number] ADVAIR DISKUS 250-50 MCG/DOSE AEPB (FLUTICASONE-SALMETEROL) one puff two times a day  #1 x 6   Entered and Authorized by:   Coralyn Helling MD   Signed by:   Coralyn Helling MD on 02/09/2010   Method used:   [facility]ronically to        [facility]  [address] [phone][facility] (retail)       [address] [place], [place]  [address]       Ph: [phone] or [phone]       Fax: [phone]   RxID:   [id number]   Appended Document: copd/former mw pt//lmr Discussed with patient his sleep pattern.  He has snoring, and reports trouble with his sleep.

## 2010-06-13 NOTE — Letter (Signed)
Summary: [facility] Office Note  [facility] Note   Imported By: Roderic Ovens 09/21/2009 16:28:58  _____________________________________________________________________  External Attachment:    Type:   Image     Comment:   External Document

## 2010-06-13 NOTE — Cardiovascular Report (Signed)
Summary: Pre Cath Orders   Pre Cath Orders   Imported By: Roderic Ovens 11/30/2009 10:26:56  _____________________________________________________________________  External Attachment:    Type:   Image     Comment:   External Document

## 2010-06-13 NOTE — Assessment & Plan Note (Signed)
Summary: Cardiology Nuclear Study**results to pt 10-14-09**  Nuclear Med Background Indications for Stress Test: Evaluation for Ischemia   History: COPD, Echo, Myocardial Perfusion Study  History Comments: '08 Echo: EF=60% '08 MPS: EF=56%, (-) ischemia, Inf. Thinning/Scar  Symptoms: Chest Pain, DOE, SOB    Nuclear Pre-Procedure Cardiac Risk Factors: History of Smoking, Hypertension Caffeine/Decaff Intake: None NPO After: 10:00 PM Lungs: clear IV 0.9% NS with Angio Cath: 18g     IV Site: (R) AC IV Started by: Stanton Kidney EMT-P Chest Size (in) 44     Height (in): 71 Weight (lb): 221 BMI: 30.93  Nuclear Med Study 1 or 2 day study:  1 day     Stress Test Type:  Eugenie Birks Reading MD:  Marca Ancona, MD     Referring MD:  D.Alison Breeding Resting Radionuclide:  Technetium 93m Tetrofosmin     Resting Radionuclide Dose:  11.0 mCi  Stress Radionuclide:  Technetium 3m Tetrofosmin     Stress Radionuclide Dose:  33.0 mCi   Stress Protocol   Lexiscan: 0.4 mg   Stress Test Technologist:  Milana Na EMT-P     Nuclear Technologist:  Domenic Polite CNMT  Rest Procedure  Myocardial perfusion imaging was performed at rest 45 minutes following the intravenous administration of Myoview Technetium 24m Tetrofosmin.  Stress Procedure  The patient received IV Lexiscan 0.4 mg over 15-seconds.  Myoview injected at 30-seconds.  There were no significant changes with infusion.  Quantitative spect images were obtained after a 45 minute delay.  QPS Raw Data Images:  Normal; no motion artifact; normal heart/lung ratio. Stress Images:  There is normal uptake in all areas. Rest Images:  Normal homogeneous uptake in all areas of the myocardium. Subtraction (SDS):  There is no evidence of scar or ischemia. Transient Ischemic Dilatation:  .98  (Normal <1.22)  Lung/Heart Ratio:  .31  (Normal <0.45)  Quantitative Gated Spect Images QGS EDV:  116 ml QGS ESV:  64 ml QGS EF:  45 % QGS cine images:   Lateral hypokinesis.    Overall Impression  Exercise Capacity: Lexiscan study BP Response: Normal blood pressure response. Clinical Symptoms: Short of breath and lightheaded.  ECG Impression: No significant ST segment change suggestive of ischemia. Overall Impression: Apical thinning with no evidence for ischemia or infarction.  Mild systolic dysfunction with lateral hypokinesis.   Appended Document: Cardiology Nuclear Study**results to pt 10-14-09** No ischemia by perfusion images but EF decreased.  Will need to discuss with patient, possibly may do cath if still symptomatic.  Will need to see back in the office soon.   Appended Document: Cardiology Nuclear Study appt 11-02-09 with Dr Shirlee Latch  Appended Document: Cardiology Nuclear Study**results to pt 10-14-09** discussed results with pt by telephone--pt states he is not having symptoms right now-he will keep appt with Dr Shirlee Latch 11-02-09

## 2010-06-13 NOTE — Assessment & Plan Note (Signed)
Summary: 3 week fu rs missed appt/mt  Medications Added PRADAXA 150 MG CAPS (DABIGATRAN ETEXILATE MESYLATE) Take 1 by mouth two times a day BISOPROLOL FUMARATE 5 MG TABS (BISOPROLOL FUMARATE) one tablet daily FLUOXETINE HCL 20 MG CAPS (FLUOXETINE HCL) 1 by mouth daily      Allergies Added: NKDA  Referring Provider:  Dr Herb Grays Primary Provider:  Dr. Herb Grays   History of Present Illness: 70 yo with history of paroxysmal atrial fibrillation, diastolic CHF, COPD, lung and colon cancers, and HTN returns for evaluation of dyspnea.  Patient had revision left TKR in 1/11.  He had post-op volume overload/pulmonary edema resolved with Lasix.  He has had significant chronic shortness of breath that has been present for at least a year.  He gets short of breath after walking < 1/4 mile or pushing his garbage to the road.  He had atypical chest pain.  Patient was started on Lasix and Spiriva.   I set him up for myoview and echo.  Lexiscan myoview showed EF 45% with lateral hypokinesis and apical thinning but no definite ischemia or infarction.  Echo showed EF 55% with mild LV hypertrophy, mild diastolic dysfunction, and normal RV size and function.    After this, patient was admitted to the hospital with fever/chills/cough and was found to have right upper lobe PNA.  In the hospital, he developed atrial fibrillation with rapid ventricular response.  He actually converted back to NSR while on a diltiazem gtt.  He was started on amiodarone and dabigatran and discharged home.  Troponin was noted to be mildly elevated (0.11) in the setting of afib/RVR.  Given history of significant exertional dyspnea, it was thought that he should get a left and right heart cath after recovery from PNA. Cath showed essentially normal right and left heart filling pressures and moderate nonobstructive coronary disease.  Plan for medical management.  PFTs in 7/11 showed moderate obstructive lung disease with borderline mild  restriction.    Patient additionally reports feeling sleepy and worn out all the time.  No energy.  He says that he had a sleep study about a year ago that did not show significant OSA.   Labs (1/11): K 3.6, creatinine 1.4, TSH normal, BNP 419 Labs (5/11): BNP 281=>132, K 4.2, creatinine 1.4 Labs (6/11): K 4.9, creatinine 1.4, BNP 259, maximal troponin in hospital 0.11 Labs (7/11): LDL 142, HDL 43, TSH normal, LFTs normal, creatinine 1.4, K 4.7  Current Medications (verified): 1)  Meclizine Hcl 25 Mg Tabs (Meclizine Hcl) .... Take 1 Tablet By Mouth Once A Day 2)  Aricept 10 Mg Tabs (Donepezil Hcl) .... Take 1 Tablet By Mouth Once A Day 3)  Depo-Testosterone 200 Mg/ml Oil (Testosterone Cypionate) .... Every 2 Weeks 4)  Furosemide 20 Mg Tabs (Furosemide) .... One Tablet Daily 5)  Klor-Con 10 10 Meq Cr-Tabs (Potassium Chloride) .... One Tablet Daily 6)  Spiriva Handihaler 18 Mcg Caps (Tiotropium Bromide Monohydrate) .... Use Daily As Directed 7)  Proair Hfa 108 (90 Base) Mcg/act Aers (Albuterol Sulfate) .... Use As Directed 8)  Aspirin 81 Mg Tbec (Aspirin) .... Take One Tablet By Mouth Daily 9)  Pradaxa 150 Mg Caps (Dabigatran Etexilate Mesylate) .... Take 1 By Mouth Two Times A Day==states He Takes 1 By Mouth Daily 10)  Omeprazole 20 Mg Cpdr (Omeprazole) .... Take 1 By Mouth Once Daily 11)  Coreg 12.5 Mg Tabs (Carvedilol) .... One Tablet Twice A Day 12)  Amiodarone Hcl 200 Mg Tabs (Amiodarone Hcl) .Marland KitchenMarland KitchenMarland Kitchen  Take One Tablet By Mouth Daily 13)  Hydrocodone-Acetaminophen 5-325 Mg Tabs (Hydrocodone-Acetaminophen) .... Take As Directed As Needed 14)  Diltiazem Hcl Cr 180 Mg Xr24h-Cap (Diltiazem Hcl) .Marland Kitchen.. 1 Once Daily 15)  Zocor 20 Mg Tabs (Simvastatin) .... One in The Evening 16)  Fluoxetine Hcl 20 Mg Caps (Fluoxetine Hcl) .Marland Kitchen.. 1 By Mouth Daily  Allergies (verified): No Known Drug Allergies  Past History:  Past Medical History: 1. 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 COLON CANCER (ICD-153.9): s/p resection in 2008.  3. OSTEOARTHRITIS (ICD-715.90)     - s/p Bilat. Hip and Knee replacements with redo R Knee replacement and redo left TKR. 4. HYPERTENSION (ICD-401.9) 5. ATRIAL FIBRILLATION, HX OF (ICD-V12.59): post-operative in 2008.  Had another episode in 6/11 in the setting of PNA.  He was started on dabigatran and on amiodarone to maintain NSR.  6. CAD: Adenosine myoview (6/08): EF 56%, fixed inferior defect may have been attenuation.  No evidence for ischemia.  Lexiscan myoview (5/11): EF 45%, lateral hypokinesis, apical thinning with no evidence for ischemia or infarction.  Left heart cath (7/11) with EF 50%, mild global hypokinesis, 50% mid RCA, 40% PLV, 50% ostial ramus.  Moderate nonobstructive disease, medical management.  7. COPD: moderate    - PFT's April 04, 2009  FEV1 33    and ratio 54    - HFA 50% April 04, 2009     - PFTs (7/11): FEV1 50%, FVC 65%, ratio 54%, TLC 80%, DLCO 69% 8. CKD 9. ? Dementia (he is on Aricept) 10. CHF: Diastolic, echo (8/08) with EF 16%, moderate LVH.  Echo (5/11): EF 55%, mild LV hypertrophy, normal wall motion, mild diastolic dysfunction, mild MR, mild LAE, normal RV size and systolic function.  RHC (7/11): mean RA 7, PA 29/15, PCWP 8.  11.  RIght upper lobe PNA 6/11  Family History: Reviewed history from 04/04/2009 and no changes required. Colon CA- Sister and Father  Social History: Reviewed history from 11/21/2009 and no changes required. Married, lives in [place] Children Former smoker.  Quit in 2008.  Smoked approx 40 yrs up to 3 ppd. Quit ETOH 2008 Working at [employer]  Review of Systems       All systems reviewed and negative except as per HPI.   Vital Signs:  Patient profile:   70 year old male Height:      72 inches Weight:      230 pounds BMI:     31.31 Pulse rate:   76 / minute Resp:     18 per minute BP sitting:   106 /  64  (left arm)  Vitals Entered By: Marrion Coy, CNA (January 05, 2010 10:16 AM)  Physical Exam  General:  Well developed, well nourished, in no acute distress.  Obese.  Neck:  Neck supple, no JVD. No masses, thyromegaly or abnormal cervical nodes. Lungs:  End expiratory wheezes. Heart:  Heart sounds distant.  Non-displaced PMI, chest non-tender; regular rate and rhythm, S1, S2 without murmurs, rubs or gallops. Carotid upstroke normal, no bruit.  Pedals normal pulses. Trace ankle edema.  Abdomen:  Bowel sounds positive; abdomen soft and non-tender without masses, organomegaly, or hernias noted. No hepatosplenomegaly. Extremities:  No clubbing or cyanosis. Neurologic:  Alert and oriented x 3. Psych:  Normal affect.   Impression & Recommendations:  Problem # 1:  CAD, NATIVE  VESSEL (ICD-414.01) Moderate nonobstuctive disease on cath in 7/11.  Continue ASA 81 mg daily, beta blocker, and statin.  Goal LDL < 70, need lipids/LFTs in 9/11.   Problem # 2:  DYSPNEA ON EXERTION (ICD-786.09) Likely a mixed picture of diastolic CHF and COPD.  He has been breathing better with Lasix and Spiriva.  He does not appear volume overloaded and filling pressures were well-controlled on Lasix on recent right heart cath.  Continue current Lasix.    Problem # 3:  COPD UNSPECIFIED (ICD-496) Moderate COPD.  some wheezing on exam.  He is using Spiriva.  I will have him followup with pulmonology (has seen pulmonary in the past) regarding COPD to optimize inhaler regimen.   - Patient was restarted on Coreg at some point, apparently in the hospital.  Given his moderate COPD and wheezing on exam, I am going to have him stop Coreg and start bisoprolol 5 mg daily (more beta-1 selective).   Problem # 4:  ATRIAL FIBRILLATION, HX OF (ICD-V12.59) Paroxysmal. Needs anticoagulation because of relatively high stroke risk.  He is now on amiodarone for maintenance  of NSR.  - Recent LFTs and PFTs on amiodarone were ok.  Needs  yearly eye exam.  - Continue anticoagulation with Pradaxa.   Other Orders: Pulmonary Referral (Pulmonary)  Patient Instructions: 1)  Your physician has recommended you make the following change in your medication:  2)  Take Pradaxa 150mg  twice a day. 3)  Stop Coreg(carvedilol). 4)  Start Bisoprolol 5mg  daily. 5)  Your physician recommends that you return for a FASTING lipid profile:/liver profile/BMP in September 2011--786.09 414.01 6)  You have been referred to pulmonary. 7)  See an optometrist for an eye exam because you take Amiodarone.  8)  Your physician recommends that you schedule a follow-up appointment in: 4 months with Dr Shirlee Latch. Prescriptions: BISOPROLOL FUMARATE 5 MG TABS (BISOPROLOL FUMARATE) one tablet daily  #30 x 11   Entered by:   Katina Dung, RN, BSN   Authorized by:   Marca Ancona, MD   Signed by:   Katina Dung, RN, BSN on 01/05/2010   Method used:   Electronically to        [facility]  [address] [phone]* (retail)       [address] [place], [place]  [address]       Ph: [phone] or [phone]       Fax: [phone]   RxID:   [id number] PRADAXA 150 MG CAPS (DABIGATRAN ETEXILATE MESYLATE) Take 1 by mouth two times a day  #60 x 11   Entered by:   Katina Dung, RN, BSN   Authorized by:   Marca Ancona, MD   Signed by:   Katina Dung, RN, BSN on 01/05/2010   Method used:   Electronically to        [facility]  [address] [phone]* (retail)       [address]       [place], [place]  [address]       Ph: [phone] or [phone]       Fax: [phone]   RxID:   [id number]

## 2010-06-13 NOTE — Assessment & Plan Note (Signed)
Summary: per check out  Medications Added ADVAIR DISKUS 250-50 MCG/DOSE AEPB (FLUTICASONE-SALMETEROL) one puff two times a day as needed SYMBICORT 80-4.5 MCG/ACT AERO (BUDESONIDE-FORMOTEROL FUMARATE) one to two puffs two times a day      Allergies Added: NKDA  Visit Type:  Follow-up Primary Provider:  Dr. Herb Grays  CC:  Pt. does c/o SOB but this is unchanged for him. No cp- dizziness- or edema..  History of Present Illness: 70 yo with history of paroxysmal atrial fibrillation, diastolic CHF, COPD, lung and colon cancers, moderate nonobstructive coronary disease, and HTN returns for evaluation of dyspnea.  He has been stable since last appointment.  No tachypalpitations to suggest recurrent atrial fibrillation.  He is in sinus rhythm today.  He is short of breath after walking about 1/4 mile.  He does not get short of breath walking up the ramp to his house.  No chest pain.  He is using both Advair and Symbicort.  Weight is down 3 lbs since last appointment.    Labs (1/11): K 3.6, creatinine 1.4, TSH normal, BNP 419 Labs (5/11): BNP 281=>132, K 4.2, creatinine 1.4 Labs (6/11): K 4.9, creatinine 1.4, BNP 259, maximal troponin in hospital 0.11 Labs (7/11): LDL 142, HDL 43, TSH normal, LFTs normal, creatinine 1.4, K 4.7  ECG: NSR, inferior Qs  Current Medications (verified): 1)  Spiriva Handihaler 18 Mcg Caps (Tiotropium Bromide Monohydrate) .... Use Daily As Directed 2)  Advair Diskus 250-50 Mcg/dose Aepb (Fluticasone-Salmeterol) .... One Puff Two Times A Day As Needed 3)  Symbicort 80-4.5 Mcg/act Aero (Budesonide-Formoterol Fumarate) .... One To Two Puffs Two Times A Day 4)  Meclizine Hcl 25 Mg Tabs (Meclizine Hcl) .... Take 1 Tablet By Mouth Once A Day 5)  Aricept 10 Mg Tabs (Donepezil Hcl) .... Take 1 Tablet By Mouth Once A Day 6)  Depo-Testosterone 200 Mg/ml Oil (Testosterone Cypionate) .... Every 2 Weeks 7)  Furosemide 20 Mg Tabs (Furosemide) .... One Tablet Daily 8)  Klor-Con  10 10 Meq Cr-Tabs (Potassium Chloride) .... One Tablet Daily 9)  Aspirin 81 Mg Tbec (Aspirin) .... Take One Tablet By Mouth Daily 10)  Pradaxa 150 Mg Caps (Dabigatran Etexilate Mesylate) .... Take 1 By Mouth Two Times A Day 11)  Omeprazole 20 Mg Cpdr (Omeprazole) .... Take 1 By Mouth Once Daily 12)  Bisoprolol Fumarate 5 Mg Tabs (Bisoprolol Fumarate) .... One Tablet Daily 13)  Amiodarone Hcl 200 Mg Tabs (Amiodarone Hcl) .... Take One Tablet By Mouth Daily 14)  Hydrocodone-Acetaminophen 5-325 Mg Tabs (Hydrocodone-Acetaminophen) .... Take As Directed As Needed 15)  Diltiazem Hcl Cr 180 Mg Xr24h-Cap (Diltiazem Hcl) .Marland Kitchen.. 1 Once Daily 16)  Zocor 20 Mg Tabs (Simvastatin) .... One in The Evening 17)  Fluoxetine Hcl 20 Mg Caps (Fluoxetine Hcl) .Marland Kitchen.. 1 By Mouth Daily  Allergies (verified): No Known Drug Allergies  Past History:  Past Medical History: Reviewed history from 02/09/2010 and no changes required. 1. 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 COLON CANCER (ICD-153.9): s/p resection in 2008.  3. OSTEOARTHRITIS (ICD-715.90)     - s/p Bilat. Hip and Knee replacements with redo R Knee replacement and redo left TKR. 4. HYPERTENSION (ICD-401.9) 5. ATRIAL FIBRILLATION, HX OF (ICD-V12.59): post-operative in 2008.  Had another episode in 6/11 in the setting of PNA.  He was started on dabigatran and on amiodarone to maintain NSR.  6. CAD: Adenosine myoview (  6/08): EF 56%, fixed inferior defect may have been attenuation.  No evidence for ischemia.  Lexiscan myoview (5/11): EF 45%, lateral hypokinesis, apical thinning with no evidence for ischemia or infarction.  Left heart cath (7/11) with EF 50%, mild global hypokinesis, 50% mid RCA, 40% PLV, 50% ostial ramus.  Moderate nonobstructive disease, medical management.  7. COPD: moderate    - PFT's 04/04/09: FEV1 33%, FEV1% 54    - PFTs 07/11: FEV1 50%, FEV1% 54, TLC 80%, DLCO  69% 8. CKD 9. ? Dementia (he is on Aricept) 10. CHF: Diastolic, echo (8/08) with EF 16%, moderate LVH.  Echo (5/11): EF 55%, mild LV hypertrophy, normal wall motion, mild diastolic dysfunction, mild MR, mild LAE, normal RV size and systolic function.  RHC (7/11): mean RA 7, PA 29/15, PCWP 8.  11.  RIght upper lobe PNA 6/11  Family History: Reviewed history from 02/09/2010 and no changes required. Colon CA- Sister and Father Family History Asthma---sister Family History Diabetes---2 sisters  Social History: Reviewed history from 11/21/2009 and no changes required. Married, lives in [place] Children Former smoker.  Quit in 2008.  Smoked approx 40 yrs up to 3 ppd. Quit ETOH 2008 Working at [employer]  Review of Systems       All systems reviewed and negative except as per HPI.   Vital Signs:  Patient profile:   70 year old male Height:      72 inches Weight:      227 pounds Pulse rate:   68 / minute Pulse rhythm:   regular BP sitting:   124 / 68  (left arm) Cuff size:   large  Vitals Entered By: Sherri Rad, RN, BSN (April 18, 2010 10:10 AM)  Physical Exam  General:  Well developed, well nourished, in no acute distress.  Obese.  Neck:  Neck supple, no JVD. No masses, thyromegaly or abnormal cervical nodes. Lungs:  Decreased breath sounds bilaterally.  Heart:  Non-displaced PMI, chest non-tender; regular rate and rhythm, S1, S2 without murmurs, rubs or gallops. Carotid upstroke normal, no bruit.  Pedals normal pulses. No edema, no varicosities. Abdomen:  Bowel sounds positive; abdomen soft and non-tender without masses, organomegaly, or hernias noted. No hepatosplenomegaly. Extremities:  No clubbing or cyanosis. Neurologic:  Alert and oriented x 3. Psych:  Normal affect.   Impression & Recommendations:  Problem # 1:  CAD, NATIVE VESSEL (ICD-414.01) Moderate nonobstuctive disease on cath in 7/11.  Continue ASA 81 mg daily, beta blocker, and statin.  Had  lipids/LFTs done recently by Dr. Collins Scotland, will call her office for a copy.  Goal LDL < 70.    Problem # 2:  DYSPNEA ON EXERTION (ICD-786.09) Likely a mixed picture of diastolic CHF and COPD.  He has been breathing better with Lasix and inhalers.  He does not appear volume overloaded and filling pressures were well-controlled on Lasix on recent right heart cath.  Continue current Lasix.  He should be taking only Advair and not Symbicort per Dr. Evlyn Courier note.   Problem # 3:  ATRIAL FIBRILLATION, HX OF (ICD-V12.59) Paroxysmal. Needs anticoagulation because of relatively high stroke risk.  He is now on amiodarone for maintenance  of NSR.  - Needs yearly PFTs and optometry examination on amiodarone.  Had LFTs and TSH recently with Dr. Collins Scotland, will call for results.  - Continue anticoagulation with Pradaxa.   Patient Instructions: 1)  Your physician has recommended you make the following change in your medication:  2)  Do  not use symbicort. Use Advair twice a day. 3)  See an optometrist for an eye exam because you take Amiodarone. 4)  Your physician wants you to follow-up in: 4 months with Dr Shirlee Latch. (April 2012)  You will receive a reminder letter in the mail two months in advance. If you don't receive a letter, please call our office to schedule the follow-up appointment.

## 2010-06-13 NOTE — Assessment & Plan Note (Signed)
Summary: PER CHECK OUT/SF   Referring Provider:  Dr Herb Grays Primary Provider:  Dr. Herb Grays  CC:  ROV; C/O Dizziness & SOB.  History of Present Illness: 70 yo with history of paroxysmal atrial fibrillation, diastolic CHF, COPD, lung and colon cancers, and HTN returns for evaluation of dyspnea.  Patient had revision left TKR in 1/11.  He had post-op volume overload/pulmonary edema resolved with Lasix.  He has had significant chronic shortness of breath that has been present for at least a year.  At last appointment, he was short of breath after walking about 50 yards or pushing his garbage to the road.  He had atypical chest pain.  Patient was started on Lasix and Spiriva.   I set him up for myoview and echo.  Lexiscan myoview showed EF 45% with lateral hypokinesis and apical thinning but no definite ischemia or infarction.  Echo showed EF 55% with mild LV hypertrophy, mild diastolic dysfunction, and normal RV size and function.    After this, patient was admitted to the hospital with fever/chills/cough and was found to have right upper lobe PNA.  In the hospital, he developed atrial fibrillation with rapid ventricular response.  He actually converted back to NSR while on a diltiazem gtt.  His medications were changed extensively in the hospital (dabigatran, amiodarone, and diltiazem CD were started and Coreg was increased to 25 mg two times a day), and he was discharged home.  Troponin was noted to be mildly elevated (0.11) in the setting of afib/RVR.  Given history of significant exertional dyspnea, it was thought that he should get a left and right heart cath after recovery from PNA. Patient is in sinus rhythm today. Since leaving the hospital, exertional dyspnea seems improved with less coughing and no chest pain.  He is still short of breath when he tries to climb a flight of steps.  He has been having episodes of lightheadedness with standing since discharge. Not orthostatic today.   ECG:  NSR,  old inferior MI  Labs (1/11): K 3.6, creatinine 1.4, TSH normal, BNP 419 Labs (5/11): BNP 281=>132, K 4.2, creatinine 1.4 Labs (6/11): K 4.2, creatinine 1.2 (at hospital discharge), maximal troponin 0.11   Current Medications (verified): 1)  Meclizine Hcl 25 Mg Tabs (Meclizine Hcl) .... Take 1 Tablet By Mouth Once A Day 2)  Paxil 40 Mg Tabs (Paroxetine Hcl) .... Take 1 Tablet By Mouth Once A Day 3)  Aricept 10 Mg Tabs (Donepezil Hcl) .... Take 1 Tablet By Mouth Once A Day 4)  Depo-Testosterone 200 Mg/ml Oil (Testosterone Cypionate) .... Every 2 Weeks 5)  Furosemide 20 Mg Tabs (Furosemide) .... One Tablet Daily 6)  Klor-Con 10 10 Meq Cr-Tabs (Potassium Chloride) .... One Tablet Daily 7)  Spiriva Handihaler 18 Mcg Caps (Tiotropium Bromide Monohydrate) .... Use Daily As Directed 8)  Proair Hfa 108 (90 Base) Mcg/act Aers (Albuterol Sulfate) .... Use As Directed 9)  Aspirin 81 Mg Tbec (Aspirin) .... Take One Tablet By Mouth Daily 10)  Pradaxa 150 Mg Caps (Dabigatran Etexilate Mesylate) .... Take 1 By Mouth Two Times A Day 11)  Omeprazole 20 Mg Cpdr (Omeprazole) .... Take 1 By Mouth Once Daily 12)  Coreg 12.5 Mg Tabs (Carvedilol) .... One Tablet Twice A Day 13)  Amiodarone Hcl 200 Mg Tabs (Amiodarone Hcl) .... Take One Tablet By Mouth Daily 14)  Hydrocodone-Acetaminophen 5-325 Mg Tabs (Hydrocodone-Acetaminophen) .... Take As Directed As Needed  Allergies (verified): No Known Drug Allergies  Past History:  Past Medical History: 1. 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 COLON CANCER (ICD-153.9): s/p resection in 2008.  3. OSTEOARTHRITIS (ICD-715.90)     - s/p Bilat. Hip and Knee replacements with redo R Knee replacement and redo left TKR. 4. HYPERTENSION (ICD-401.9) 5. ATRIAL FIBRILLATION, HX OF (ICD-V12.59): post-operative in 2008.  Had another episode in 6/11 in the setting of PNA.  He was started on  dabigatran and on amiodarone to maintain NSR.  6. Adenosine myoview (6/08): EF 56%, fixed inferior defect may have been attenuation.  No evidence for ischemia.  Lexiscan myoview (5/11): EF 45%, lateral hypokinesis, apical thinning with no evidence for ischemia or infarction.   7. COPD    - PFT's April 04, 2009  FEV1 33    and ratio 54    - HFA 50% April 04, 2009  8. CKD 9. ? Dementia (he is on Aricept) 10. CHF: Diastolic, echo (8/08) with EF 95%, moderate LVH.  Echo (5/11): EF 55%, mild LV hypertrophy, normal wall motion, mild diastolic dysfunction, mild MR, mild LAE, normal RV size and systolic function.   11.  RIght upper lobe PNA 6/11  Family History: Reviewed history from 04/04/2009 and no changes required. Colon CA- Sister and Father  Social History: Reviewed history from 09/20/2009 and no changes required. Married, lives in [place] Former smoker.  Quit in 2008.  Smoked approx 40 yrs up to 3 ppd. 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   Review of Systems       All systems reviewed and negative except as per HPI.   Vital Signs:  Patient profile:   71 year old male Height:      72 inches Weight:      229 pounds BMI:     31.17 Pulse rate:   70 / minute BP sitting:   138 / 80  (left arm) Cuff size:   regular  Vitals Entered By: Stanton Kidney, EMT-P (November 02, 2009 11:15 AM)  Serial Vital Signs/Assessments:  Time      Position  BP       Pulse  Resp  Temp     By 11:23 AM            125/70   70                    Stanton Kidney, EMT-P 11:25 AM            122/69   70                    Stanton Kidney, EMT-P 11:25 AM            119/68   74                    Stanton Kidney, EMT-P 11:28 AM            125/70   76                    Stanton Kidney, EMT-P 11:30               130/69   76                    Stanton Kidney, EMT-P  Comments:  11:23 AM Lying (L) Arm: No symptoms By: Stanton Kidney, EMT-P  11:25 AM Sitting (L) Arm:  Dizzy at first > Better By: Stanton Kidney, EMT-P  11:25 AM 0 Min Standing (L) Arm: slight dizziness > Better By: Stanton Kidney, EMT-P  11:28 AM 2 min. Standing (L): No symptoms By: Stanton Kidney, EMT-P  11:30 5 min Standing (L) Arm: No symptoms By: Stanton Kidney, EMT-P    Physical Exam  General:  Well developed, well nourished, in no acute distress.  Obese.  Neck:  Neck supple, no JVD. No masses, thyromegaly or abnormal cervical nodes. Lungs:  Mildly decreased breath sounds bilaterally.  Heart:  Non-displaced PMI, chest non-tender; regular rate and rhythm, S1, S2 without murmurs, rubs or gallops. Carotid upstroke normal, no bruit.  Pedals normal pulses. 1+ edema 1/3 up lower legs bilaterally.  Abdomen:  Bowel sounds positive; abdomen soft and non-tender without masses, organomegaly, or hernias noted. No hepatosplenomegaly. Extremities:  No clubbing or cyanosis. Neurologic:  Alert and oriented x 3. Psych:  Normal affect.   Impression & Recommendations:  Problem # 1:  CAD, NATIVE VESSEL (ICD-414.01) Assessment Unchanged Patient has significant exertional dyspnea probably due to a combination of diastolic CHF and COPD as well as atypical chest pain.  Dyspnea has improved somewhat with diuresis.  Troponin was elevated with atrial fibrillation/RVR while in the hospital.  Myoview showed decreased EF but no definite ischemia or infarction.  Plan at discharge from hospital was left and right heart cath after he has recovered from PNA.  I think that this is a reasonable plan.  He wants to wait a couple of weeks until after family has visited, which I think is reasonable.  He will need to stop Pradaxa 3 days before procedure and we will followup on his creatinine.   Problem # 2:  DYSPNEA ON EXERTION (ICD-786.09) Likely a mixed picture of diastolic CHF (? ischemic) and COPD.  He is breathing better with Lasix and Spiriva.  He appears euvolemic on exam.  I think it would be ok to decrease Lasix to 20  mg daily with KCl 10 mEq daily.  BMET/BNP today.   Problem # 3:  ATRIAL FIBRILLATION, HX OF (ICD-V12.59) Paroxysmal. Needs anticoagulation because of relatively high stroke risk.  He is now on amiodarone for maintenance  of NSR.  - Decrease amiodarone to 200 mg daily.  TSH and LFTs when he returns.  Will also need baseline PFTs.  - Stop dabigatran for cath (3 days prior), resume after cath.    Problem # 4:  LIGHTHEADEDNESS Patient reports occasional lightheadedness with standing thought he is not orthostatic today.  He is on higher doses of BP-active meds since leaving the [facility].  I will have him decrease Coreg to 12.5 mg daily (1/2 current dose).  As above, I also decreased his Lasix.   Other Orders: TLB-BMP (Basic Metabolic Panel-BMET) (80048-METABOL) TLB-BNP (B-Natriuretic Peptide) (83880-BNPR)  Patient Instructions: 1)  Your physician has recommended you make the following change in your medication:  2)  Decrease Furosemide(lasix) to 20mg  daily.--you can take one-half of a 40mg  tablet daily 3)  Decrease Potassium(KCL) to 10 mEq daily 4)  Decrease Coreg(carvedilol) to 12.5mg  twice a day--you can take one-half of a 25mg  tablet twice a day 5)  Decrease Amiodarone to 200mg  daily 6)  Your physician recommends that you have lab today---BMP/BNP 7)  Your physician recommends that you schedule a follow-up appointment with Dr Shirlee Latch Monday July 11,2011. Prescriptions: AMIODARONE HCL 200 MG TABS (AMIODARONE HCL)  Take one tablet by mouth daily  #30 x 11   Entered by:   Katina Dung, RN, BSN   Authorized by:   Marca Ancona, MD   Signed by:   Katina Dung, RN, BSN on 11/02/2009   Method used:   Elect[facility] to        [facility] [phone][facility] ([facility]ail)       [address] [place], [place]  [address]       Ph: [phone] or [phone]       Fax: [phone]   RxID:   [id number] COREG 12.5 MG TABS (CARVEDILOL) one tablet twice a day  #60 x 6   Entered by:   Katina Dung, RN, BSN   Authorized by:   Marca Ancona, MD   Signed by:   Katina Dung, RN, BSN on 11/02/2009   Method used:   [facility] to        [facility] [phone]* (retail)       [address]       [place], [place]  [address]       Ph: [phone] or [phone]       Fax: [phone]   RxID:   [id number] KLOR-CON 10 10 MEQ CR-TABS (POTASSIUM CHLORIDE) one tablet daily  #30 x 11   Entered by:   Katina Dung, RN, BSN   Authorized by:   Marca Ancona, MD   Signed by:   Katina Dung, RN, BSN on 11/02/2009   Method used:   [facility] to        [facility] [phone][facility] (retail)       [address] [place], [place]  [address]       Ph: [phone] or [phone]       Fax: [phone]   RxID:   [id number] FUROSEMIDE 20 MG TABS (FUROSEMIDE) one tablet daily  #30 x 6   Entered by:   Katina Dung, RN, BSN   Authorized by:   Marca Ancona, MD   Signed by:   Katina Dung, RN, BSN on 11/02/2009   Method used:   [facility]        [facility] [phone]* (retail)       [address]       [place], [place]  [address]       Ph: [phone] or [phone]       Fax: [phone]   RxID:   [id number]

## 2010-06-13 NOTE — Progress Notes (Signed)
Summary: Nuclear Pre-Procedure  Phone Note Outgoing Call   Call placed by: Stanton Kidney, EMT-P,  Oct 06, 2009 2:34 PM Call placed to: Patient Action Taken: Phone Call Completed Summary of Call: Reviewed information on Myoview Information Sheet (see scanned document for further details).  Spoke with Patient.    Nuclear Med Background Indications for Stress Test: Evaluation for Ischemia   History: COPD, Echo, Myocardial Perfusion Study  History Comments: '08 Echo: EF=60% '08 MPS: EF=56%, (-) ischemia, Inf. Thinning/Scar  Symptoms: Chest Pain, DOE, SOB    Nuclear Pre-Procedure Cardiac Risk Factors: History of Smoking, Hypertension Height (in): 71

## 2010-06-13 NOTE — Assessment & Plan Note (Signed)
  Nurse Visit   Vital Signs:  Patient profile:   70 year old male Height:      72 inches Weight:      229 pounds BMI:     31.17  Visit Type:  Follow-up Referring Provider:  Dr Herb Grays Primary Provider:  Dr. Herb Grays  CC:  review meds.  History of Present Illness: PT  CAME IN TODAY WITH MEDS . MEDS REVIEWED PT HAD AN EMPTY BOTTLE OF DILTIAZEM IN MED BAG STATED WAS TO PICK UP AT PHARMACY  NOT SURE IF NEEDS TO BE ON AS WELL AS COREG . PLEASE REVIEW MED LIST AND ADVISE THANKS.   Current Medications (verified): 1)  Meclizine Hcl 25 Mg Tabs (Meclizine Hcl) .... Take 1 Tablet By Mouth Once A Day 2)  Paxil 40 Mg Tabs (Paroxetine Hcl) .... Take 1 Tablet By Mouth Once A Day 3)  Aricept 10 Mg Tabs (Donepezil Hcl) .... Take 1 Tablet By Mouth Once A Day 4)  Depo-Testosterone 200 Mg/ml Oil (Testosterone Cypionate) .... Every 2 Weeks 5)  Furosemide 20 Mg Tabs (Furosemide) .... One Tablet Daily 6)  Klor-Con 10 10 Meq Cr-Tabs (Potassium Chloride) .... One Tablet Daily 7)  Spiriva Handihaler 18 Mcg Caps (Tiotropium Bromide Monohydrate) .... Use Daily As Directed 8)  Proair Hfa 108 (90 Base) Mcg/act Aers (Albuterol Sulfate) .... Use As Directed 9)  Aspirin 81 Mg Tbec (Aspirin) .... Take One Tablet By Mouth Daily 10)  Pradaxa 150 Mg Caps (Dabigatran Etexilate Mesylate) .... Take 1 By Mouth Two Times A Day 11)  Omeprazole 20 Mg Cpdr (Omeprazole) .... Take 1 By Mouth Once Daily 12)  Coreg 12.5 Mg Tabs (Carvedilol) .... One Tablet Twice A Day 13)  Amiodarone Hcl 200 Mg Tabs (Amiodarone Hcl) .... Take One Tablet By Mouth Daily 14)  Hydrocodone-Acetaminophen 5-325 Mg Tabs (Hydrocodone-Acetaminophen) .... Take As Directed As Needed 15)  Diltiazem Hcl Cr 180 Mg Xr24h-Cap (Diltiazem Hcl) .Marland Kitchen.. 1 Once Daily  Allergies: No Known Drug Allergies  Appended Document:  He is taking Coreg and amiodarone.  I think that he can stay off diltiazem for now.   Appended Document:  mailbox full--unable to  leave voice message  Appended Document:  discussed with pt by telephone--he had been taking Diltiazem  regularly and was taking it when he saw Dr Shirlee Latch 11/02/09--he refilled the medication and is currently taking it--

## 2010-06-13 NOTE — Letter (Signed)
Summary: Cardiac Catheterization Instructions- [facility]  [facility], Main Office  [address]   [place], [place] [address]   Phone: [phone]  Fax: [phone]     11/21/2009 MRN: [id number]  [name] [address] [place], [place]  [address]  Dear [name],   You are scheduled for a Cardiac Catheterization on Thursday July 21,2011 with Dr. Marca Ancona.  Please arrive to the 1st floor of the Heart and Vascular Center at [facility] at 9:30 am  on the day of your procedure. Please do not arrive before 6:30 a.m. Call the Heart and Vascular Center at [phone] if you are unable to make your appointmnet. The Code to get into the parking garage under the building is 1000. Take the elevators to the 1st floor. You must have someone to drive you home. Someone must be with you for the first 24 hours after you arrive home. Please wear clothes that are easy to get on and off and wear slip-on shoes. Do not eat or drink after midnight except water with your medications that morning. Bring all your medications and current insurance cards with you.  _x__ DO NOT take these medications before your procedure:  DO NOT TAKE PRADAXA STARTING MONDAY JULY 18 UNTIL AFTER THE CATH ON THURSDAY JULY 21. __x_ Make sure you take your aspirin.  __x_ You may take ALL of your other medications with water that morning. ________________________________________________________________________________________________________________________________  RETURN FOR LAB MONDAY JULY [phone].   The usual length of stay after your procedure is 2 to 3 hours. This can vary.  If you have any questions, please call the office at the number listed above.   Katina Dung, RN, BSN

## 2010-06-13 NOTE — Assessment & Plan Note (Signed)
Summary: EC6/PALPS/HX OF CHF/JML  Medications Added PROVENTIL HFA 108 (90 BASE) MCG/ACT  AERS (ALBUTEROL SULFATE) 1-2 puffs every 4-6 hours as needed-PT OUT FUROSEMIDE 40 MG TABS (FUROSEMIDE) one tablet daily DILTIAZEM HCL CR 180 MG XR24H-CAP (DILTIAZEM HCL) once daily KLOR-CON M20 20 MEQ CR-TABS (POTASSIUM CHLORIDE CRYS CR) one tablet daily SPIRIVA HANDIHALER 18 MCG CAPS (TIOTROPIUM BROMIDE MONOHYDRATE) use daily as directed      Allergies Added: NKDA  Primary Provider:  Dr. Herb Grays  CC:  palps/hx of chf. Pt reports a change in his BP med from Bystolic.  He does not know what it was changed to.  Pt also asking if Dr. would give him Lasix because he is feeling so full of fluid.  Pt reports that his feet and hands have been swelling often.[name]  History of Present Illness: 70 yo with history of post-op atrial fibrillation, diastolic CHF, COPD, lung and colon cancers, and HTN presents for evaluation of dyspnea.  Patient had revision left TKR in 1/11.  He had post-op volume overload/pulmonary edema resolved with Lasix.  He has significant chronic shortness of breath that has been present for at least a year.  He is short of breath after walking about 50 yards or pushing his garbage to the road.  He can walk about the length of a city block before he has to stop.  He gets occasional stabbing pain in his chest that is atypical in nature (nonexertional).  Patient was recently seen by Dr. Collins Scotland who noted sinus tachycardia with frequent PACs.  He was referred for cardiology evaluation.    ECG: NSR, PACs, old inferior MI  Labs (1/11): K 3.6, creatinine 1.4, TSH normal, BNP 419 Labs (5/11): BNP 281   Current Medications (verified): 1)  Meclizine Hcl 25 Mg Tabs (Meclizine Hcl) .... Take 1 Tablet By Mouth Once A Day 2)  Levitra 20 Mg Tabs (Vardenafil Hcl) .... 1/2 Tab By Mouth Prior To Sexual Activity 3)  Proventil Hfa 108 (90 Base) Mcg/act  Aers (Albuterol Sulfate) .[name].. 1-2 Puffs Every 4-6 Hours As  Needed-Pt Out 4)  Paxil 40 Mg Tabs (Paroxetine Hcl) .... Take 1 Tablet By Mouth Once A Day 5)  Aricept 10 Mg Tabs (Donepezil Hcl) .... Take 1 Tablet By Mouth Once A Day 6)  Depo-Testosterone 200 Mg/ml Oil (Testosterone Cypionate) .... Every 2 Weeks 7)  Hydrochlorothiazide 25 Mg Tabs (Hydrochlorothiazide) .[name].. 1 Once Daily 8)  Aleve 220 Mg Tabs (Naproxen Sodium) .... As Directed As Needed 9)  Diltiazem Hcl Cr 180 Mg Xr24h-Cap (Diltiazem Hcl) .... Once Daily  Allergies (verified): No Known Drug Allergies  Past History:  Past Medical History: 1. 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 COLON CANCER (ICD-153.9): s/p resection in 2008.  3. OSTEOARTHRITIS (ICD-715.90)     - s/p Bilat. Hip and Knee replacements with redo R Knee replacement and redo left TKR. 4. HYPERTENSION (ICD-401.9) 5. ATRIAL FIBRILLATION, HX OF (ICD-V12.59): post-operative in 2008. 6. Adenosine myoview (6/08): EF 56%, fixed inferior defect may have been attenuation.  No evidence for ischemia.  7. COPD    - PFT's April 04, 2009  FEV1 33    and ratio 54    - HFA 50% April 04, 2009  8. CKD 9. ? Dementia (he is on Aricept) 10. CHF: Diastolic, echo (8/08) with EF 16%, moderate LVH  Family History: Reviewed history from 04/04/2009 and  no changes required. Colon CA- Sister and Father  Social History: Married, lives in [place] Former smoker.  Quit in 2008.  Smoked approx 40 yrs up to 3 ppd. 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   Review of Systems       All systems reviewed and negative except as per HPI.   Vital Signs:  Patient profile:   70 year old male Height:      71 inches Weight:      226 pounds BMI:     31.63 Pulse rate:   88 / minute Pulse rhythm:   regular BP sitting:   138 / 80  (left arm) Cuff size:   regular  Vitals Entered By: Judithe Modest CMA  (Sep 20, 2009 11:18 AM)  Physical Exam  General:  Well developed, well nourished, in no acute distress. Head:  normocephalic and atraumatic Nose:  no deformity, discharge, inflammation, or lesions Mouth:  Teeth, gums and palate normal. Oral mucosa normal. Neck:  Neck supple, JVP 8-9 cm. No masses, thyromegaly or abnormal cervical nodes. Lungs:  Distant breath sounds, no crackles or wheezes.  Heart:  Non-displaced PMI, chest non-tender; regular rate and rhythm, S1, S2 without murmurs, rubs or gallops. Carotid upstroke normal, no bruit.  Pedals normal pulses. 1+ edema 2/3 up lower legs bilaterally.  Abdomen:  Bowel sounds positive; abdomen soft and non-tender without masses, organomegaly, or hernias noted. No hepatosplenomegaly. Msk:  Back normal, normal gait. Muscle strength and tone normal. Extremities:  No clubbing or cyanosis. Neurologic:  Alert and oriented x 3. Skin:  Tattoos Psych:  Normal affect.   Impression & Recommendations:  Problem # 1:  DYSPNEA ON EXERTION (ICD-786.09) This is likely multifactorial with significant COPD.  However, the patient also appears volume overloaded on exam with elevated BNP.  He has a history of diastolic CHF (needed diuresis post-op recent redo L TKR).   - Add Spiriva to regimen for COPD (he is only using albuterol) - Echo to assess LV systolic function and valves (could tachycardia signify decreased LV systolic function?)  - Start Lasix 40 mg daily + KCl 20 mEq daily, d/c HCTZ, and check BMET/BNP in 2 wks.   Problem # 2:  ATRIAL FIBRILLATION, HX OF (ICD-V12.59) No documented recurrence since 2008.  He is in NSR today.   Problem # 3:  CAD, NATIVE VESSEL (ICD-414.01) Patient had a myoview that showed a fixed inferior defect in 2008.  Given inferior Qs on ECG, the myoview defect may be due to prior infarction rather than attenuation.  The patient now has atypical chest pain but significant exertional dyspnea.  He needs to start ASA 81 mg daily.   Lipids/LFTs next appointment.   Other Orders: Nuclear Stress Test (Nuc Stress Test) Echocardiogram (Echo) TLB-BNP (B-Natriuretic Peptide) (83880-BNPR)  Patient Instructions: 1)  Your physician has recommended you make the following change in your medication:  2)  Take Aspirin 81mg  daily--this should be buffered or coated 3)  Stop HCTZ(hydrochlorothiazide) 4)  Start Lasix(furosemide) 40mg  daily 5)  Start KCL(potassium) daily  6)  Start Spiriva 7)  Lab today--BNP 428.32 786.09 8)  Your physician recommends that you return for lab work in: 2 weeks---428.32 786.09 BMP/BNP 9)  Your physician has requested that you have an echocardiogram.  Echocardiography is a painless test that uses sound waves to create images of your heart. It provides your doctor with information about the size and shape  of your heart and how well your heart's chambers and valves are working.  This procedure takes approximately one hour. There are no restrictions for this procedure. 10)  Your physician has requested that you have an lexiscan myoview.  For further information please visit [email or web address]  Please follow instruction sheet, as given. 11)  Your physician recommends that you schedule a follow-up appointment in: 1 month with Dr Shirlee Latch. Prescriptions: SPIRIVA HANDIHALER 18 MCG CAPS (TIOTROPIUM BROMIDE MONOHYDRATE) use daily as directed  #1 x 6   Entered by:   Katina Dung, RN, BSN   Authorized by:   Marca Ancona, MD   Signed by:   Katina Dung, RN, BSN on 09/20/2009   Method used:   [facility] to        [facility] [phone]* (retail)       [address] [place], [place]  [address]       Ph: [phone] or [phone]       Fax: [phone]   RxID:   [id number] KLOR-CON M20 20 MEQ CR-TABS (POTASSIUM CHLORIDE CRYS CR) one tablet daily  #30 x 6   Entered by:   Katina Dung, RN, BSN   Authorized by:   Marca Ancona, MD   Signed by:   Katina Dung, RN, BSN on 09/20/2009    Method used:   [facility] [phone]* (retail)       [address] [place], [place]  [address]       Ph: [phone] or [phone]       Fax: [phone]   RxID:   [id number] FUROSEMIDE 40 MG TABS (FUROSEMIDE) one tablet daily  #30 x 6   Entered by:   Katina Dung, RN, BSN   Authorized by:   Marca Ancona, MD   Signed by:   Katina Dung, RN, BSN on 09/20/2009   Method used:   [facility] [phone]* (retail)       [address]       [place], [place]  [address]       Ph: [phone] or [phone]       Fax: [phone]   RxID:   [id number]

## 2010-06-13 NOTE — Letter (Signed)
Summary: [facility] Office Note  [facility] Note   Imported By: Roderic Ovens 09/21/2009 16:44:33  _____________________________________________________________________  External Attachment:    Type:   Image     Comment:   External Document

## 2010-06-15 NOTE — Procedures (Signed)
Summary: Oximetry/[facility]  Oximetry/[facility]   Imported By: Lester [place] 05/02/2010 08:00:49  _____________________________________________________________________  External Attachment:    Type:   Image     Comment:   External Document

## 2010-06-15 NOTE — Miscellaneous (Signed)
Summary: Overnight oximetry on room air.   Clinical Lists Changes Test time 11hrs 26 min.  Mean SpO2 92%, low SpO2 65%.  Spent 61 min with SpO2 < 88%.  Desaturation pattern suggestive of REM related sleep disordered breathing.  Results d/w pt over the phone.  He had sleep study done years ago, and told he has sleep apnea.  He has never been on therapy for sleep apnea.    Explained to him how sleep apnea can affect his health.  Discussed driving precautions, and importance of weight loss.  Will arrange for sleep study to further determine whether he needs additional therapy for sleep apnea versus supplemental oxygen for sleep related hypoventilation/hypoxemia. Problems: Added new problem of OBSTRUCTIVE SLEEP APNEA (ICD-327.23) Orders: Added new Referral order of Sleep Disorder Referral (Sleep Disorder) - Signed

## 2010-06-15 NOTE — Assessment & Plan Note (Signed)
Summary: rov 2 months///kp   Visit Type:  Follow-up Copy to:  Marca Ancona, Karle Plumber Primary Torren Maffeo/Referring Uma Jerde:  Dr. Herb Grays  CC:  COPD follow-up...pt says his breathing has improved on Advair.[facility].  History of Present Illness: 70 yo male with GOLD 2 COPD.  His breathing has been doing okay.  He is using advair two times a day.  He has not needed to use albuterol much.  He is not using spiriva on a regular basis.  He feels that advair works better.  He is not using symbicort.  He never had his ONO done.  Preventive Screening-Counseling & Management  Alcohol-Tobacco     Alcohol drinks/day: 0     Smoking Status: quit     Packs/Day: 1.5     Year Started: 1950?     Year Quit: 1998  Current Medications (verified): 1)  Advair Diskus 250-50 Mcg/dose Aepb (Fluticasone-Salmeterol) .... One Puff Two Times A Day As Needed 2)  Meclizine Hcl 25 Mg Tabs (Meclizine Hcl) .... Take 1 Tablet By Mouth Once A Day 3)  Aricept 10 Mg Tabs (Donepezil Hcl) .... Take 1 Tablet By Mouth Once A Day 4)  Depo-Testosterone 200 Mg/ml Oil (Testosterone Cypionate) .... Every 2 Weeks 5)  Furosemide 20 Mg Tabs (Furosemide) .... One Tablet Daily 6)  Klor-Con 10 10 Meq Cr-Tabs (Potassium Chloride) .... One Tablet Daily 7)  Aspirin 81 Mg Tbec (Aspirin) .... Take One Tablet By Mouth Daily 8)  Pradaxa 150 Mg Caps (Dabigatran Etexilate Mesylate) .... Take 1 By Mouth Two Times A Day 9)  Omeprazole 20 Mg Cpdr (Omeprazole) .... Take 1 By Mouth Once Daily 10)  Bisoprolol Fumarate 5 Mg Tabs (Bisoprolol Fumarate) .... One Tablet Daily 11)  Amiodarone Hcl 200 Mg Tabs (Amiodarone Hcl) .... Take One Tablet By Mouth Daily 12)  Hydrocodone-Acetaminophen 5-325 Mg Tabs (Hydrocodone-Acetaminophen) .... Take As Directed As Needed 13)  Diltiazem Hcl Cr 180 Mg Xr24h-Cap (Diltiazem Hcl) .Marland Kitchen.. 1 Once Daily 14)  Fluoxetine Hcl 20 Mg Caps (Fluoxetine Hcl) .Marland Kitchen.. 1 By Mouth  Daily  Allergies (verified): No Known Drug Allergies  Past History:  Past Medical History: Reviewed history from 02/09/2010 and no changes required. 1. 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 COLON CANCER (ICD-153.9): s/p resection in 2008.  3. OSTEOARTHRITIS (ICD-715.90)     - s/p Bilat. Hip and Knee replacements with redo R Knee replacement and redo left TKR. 4. HYPERTENSION (ICD-401.9) 5. ATRIAL FIBRILLATION, HX OF (ICD-V12.59): post-operative in 2008.  Had another episode in 6/11 in the setting of PNA.  He was started on dabigatran and on amiodarone to maintain NSR.  6. CAD: Adenosine myoview (6/08): EF 56%, fixed inferior defect may have been attenuation.  No evidence for ischemia.  Lexiscan myoview (5/11): EF 45%, lateral hypokinesis, apical thinning with no evidence for ischemia or infarction.  Left heart cath (7/11) with EF 50%, mild global hypokinesis, 50% mid RCA, 40% PLV, 50% ostial ramus.  Moderate nonobstructive disease, medical management.  7. COPD: moderate    - PFT's 04/04/09: FEV1 33%, FEV1% 54    - PFTs 07/11: FEV1 50%, FEV1% 54, TLC 80%, DLCO 69% 8. CKD 9. ? Dementia (he is on Aricept) 10. CHF: Diastolic, echo (8/08) with EF 16%, moderate LVH.  Echo (5/11): EF 55%, mild LV hypertrophy, normal wall motion, mild diastolic dysfunction,  mild MR, mild LAE, normal RV size and systolic function.  RHC (7/11): mean RA 7, PA 29/15, PCWP 8.  11.  RIght upper lobe PNA 6/11  Past Surgical History: Reviewed history from 02/09/2010 and no changes required. Left upper lobe wedge resection 2008 Low anterior resection of sigmoid and proximal rectum 2008 Bilateral hip replacement  Bilateral knee replacement  Review of Systems       The patient complains of non-productive cough.  The patient denies shortness of breath with activity, shortness of breath at rest, productive cough, coughing up blood, chest  pain, abdominal pain, difficulty swallowing, sore throat, nasal congestion/difficulty breathing through nose, hand/feet swelling, change in color of mucus, and fever.    Vital Signs:  Patient profile:   70 year old male Height:      72 inches (182.88 cm) Weight:      230 pounds (104.55 kg) BMI:     31.31 O2 Sat:      95 % on Room air Temp:     98.6 degrees F (37.00 degrees C) oral Pulse rate:   73 / minute BP sitting:   130 / 82  (left arm) Cuff size:   regular  Vitals Entered By: Michel Bickers CMA (April 21, 2010 9:51 AM)  O2 Sat at Rest %:  95 O2 Flow:  Room air CC: COPD follow-up...pt says his breathing has improved on Advair.[facility] Kitchen[facility] they never received order for ONO Comments Medications reviewed with patient Michel Bickers [facility]  April 21, 2010 9:51 AM   Physical Exam  General:  obese.   Nose:  no deformity, discharge, inflammation, or lesions Mouth:  MP 3, no exudate Neck:  no JVD.   Lungs:  diminished breath sounds, no wheezing or rales, no dullness Heart:  regular rhythm, normal rate, and no murmurs.   Extremities:  trace leg edema, no cyanosis or clubbing Neurologic:  normal CN II-XII and strength normal.   Cervical Nodes:  no significant adenopathy Psych:  alert and cooperative; normal mood and affect; normal attention span and concentration   Impression & Recommendations:  Problem # 1:  COPD UNSPECIFIED (ICD-496) He has done well with advair.  Will have him continue this and as needed ventolin.  Will re-arrange ONO on room air.  Problem # 2:  ATRIAL FIBRILLATION, HX OF (ICD-V12.59) He is on amiodarone and will need f/u PFT July 2012.  Medications Added to Medication List This Visit: 1)  Ventolin Hfa 108 (90 Base) Mcg/act Aers (Albuterol sulfate) .... Two puffs up to four times per day as needed  Complete Medication List: 1)  Advair Diskus 250-50 Mcg/dose Aepb (Fluticasone-salmeterol) .... One puff two times a day as needed 2)  Meclizine Hcl 25 Mg  Tabs (Meclizine hcl) .... Take 1 tablet by mouth once a day 3)  Aricept 10 Mg Tabs (Donepezil hcl) .... Take 1 tablet by mouth once a day 4)  Depo-testosterone 200 Mg/ml Oil (Testosterone cypionate) .... Every 2 weeks 5)  Furosemide 20 Mg Tabs (Furosemide) .... One tablet daily 6)  Klor-con 10 10 Meq Cr-tabs (Potassium chloride) .... One tablet daily 7)  Aspirin 81 Mg Tbec (Aspirin) .... Take one tablet by mouth daily 8)  Pradaxa 150 Mg Caps (Dabigatran etexilate mesylate) .... Take 1 by mouth two times a day 9)  Omeprazole 20 Mg Cpdr (Omeprazole) .... Take 1 by mouth once daily 10)  Bisoprolol Fumarate 5 Mg Tabs (Bisoprolol fumarate) .... One tablet daily 11)  Amiodarone Hcl 200  Mg Tabs (Amiodarone hcl) .... Take one tablet by mouth daily 12)  Hydrocodone-acetaminophen 5-325 Mg Tabs (Hydrocodone-acetaminophen) .... Take as directed as needed 13)  Diltiazem Hcl Cr 180 Mg Xr24h-cap (Diltiazem hcl) .Marland Kitchen.. 1 once daily 14)  Fluoxetine Hcl 20 Mg Caps (Fluoxetine hcl) .Marland Kitchen.. 1 by mouth daily 15)  Ventolin Hfa 108 (90 Base) Mcg/act Aers (Albuterol sulfate) .... Two puffs up to four times per day as needed  Other Orders: Est. Patient Level III (78295) DME Referral (DME)  Patient Instructions: 1)  Stop spiriva 2)  Stop symbicort 3)  Advair one puff two times a day, and rinse mouth after using 4)  Ventolin two puffs up to four times per day as needed for cough, wheeze, chest congestion, or shortness of breath 5)  Will schedule oxygen test at night 6)  Follow up in 6 months Prescriptions: ADVAIR DISKUS 250-50 MCG/DOSE AEPB (FLUTICASONE-SALMETEROL) one puff two times a day as needed  #1 x 6   Entered and Authorized by:   Coralyn Helling MD   Signed by:   Coralyn Helling MD on 04/21/2010   Method used:   Electronically to        [facility]  [address] [phone]* (retail)       [address] [place], [place]  [address]       Ph: [phone] or [phone]       Fax: [phone]   RxID:    [id number] VENTOLIN HFA 108 (90 BASE) MCG/ACT AERS (ALBUTEROL SULFATE) two puffs up to four times per day as needed  #1 x 6   Entered and Authorized by:   Coralyn Helling MD   Signed by:   Coralyn Helling MD on 04/21/2010   Method used:   Electronically to        [facility]  [address] [phone]* (retail)       [address] [place], [place]  [address]       Ph: [phone] or [phone]       Fax: [phone]   RxID:   [id number]    Immunization History:  Influenza Immunization History:    Influenza:  historical (02/11/2010)

## 2010-06-29 ENCOUNTER — Ambulatory Visit (HOSPITAL_BASED_OUTPATIENT_CLINIC_OR_DEPARTMENT_OTHER): Payer: Medicare Other | Attending: Pulmonary Disease

## 2010-06-29 ENCOUNTER — Encounter: Payer: Self-pay | Admitting: Pulmonary Disease

## 2010-06-29 DIAGNOSIS — G4710 Hypersomnia, unspecified: Secondary | ICD-10-CM | POA: Insufficient documentation

## 2010-06-29 DIAGNOSIS — R0609 Other forms of dyspnea: Secondary | ICD-10-CM | POA: Insufficient documentation

## 2010-06-29 DIAGNOSIS — R0989 Other specified symptoms and signs involving the circulatory and respiratory systems: Secondary | ICD-10-CM | POA: Insufficient documentation

## 2010-06-29 DIAGNOSIS — J449 Chronic obstructive pulmonary disease, unspecified: Secondary | ICD-10-CM | POA: Insufficient documentation

## 2010-06-29 DIAGNOSIS — G4730 Sleep apnea, unspecified: Secondary | ICD-10-CM | POA: Insufficient documentation

## 2010-06-29 DIAGNOSIS — I2510 Atherosclerotic heart disease of native coronary artery without angina pectoris: Secondary | ICD-10-CM | POA: Insufficient documentation

## 2010-06-29 DIAGNOSIS — I10 Essential (primary) hypertension: Secondary | ICD-10-CM | POA: Insufficient documentation

## 2010-06-29 DIAGNOSIS — J4489 Other specified chronic obstructive pulmonary disease: Secondary | ICD-10-CM | POA: Insufficient documentation

## 2010-06-29 DIAGNOSIS — I4891 Unspecified atrial fibrillation: Secondary | ICD-10-CM | POA: Insufficient documentation

## 2010-06-29 DIAGNOSIS — R404 Transient alteration of awareness: Secondary | ICD-10-CM | POA: Insufficient documentation

## 2010-07-05 DIAGNOSIS — G4710 Hypersomnia, unspecified: Secondary | ICD-10-CM

## 2010-07-05 DIAGNOSIS — G4730 Sleep apnea, unspecified: Secondary | ICD-10-CM

## 2010-07-05 DIAGNOSIS — R404 Transient alteration of awareness: Secondary | ICD-10-CM

## 2010-07-05 DIAGNOSIS — R0989 Other specified symptoms and signs involving the circulatory and respiratory systems: Secondary | ICD-10-CM

## 2010-07-05 DIAGNOSIS — J449 Chronic obstructive pulmonary disease, unspecified: Secondary | ICD-10-CM

## 2010-07-05 DIAGNOSIS — R0609 Other forms of dyspnea: Secondary | ICD-10-CM

## 2010-07-07 ENCOUNTER — Ambulatory Visit (INDEPENDENT_AMBULATORY_CARE_PROVIDER_SITE_OTHER): Payer: Medicare Other | Admitting: Pulmonary Disease

## 2010-07-07 ENCOUNTER — Encounter: Payer: Self-pay | Admitting: Pulmonary Disease

## 2010-07-07 DIAGNOSIS — J449 Chronic obstructive pulmonary disease, unspecified: Secondary | ICD-10-CM

## 2010-07-07 DIAGNOSIS — Z85118 Personal history of other malignant neoplasm of bronchus and lung: Secondary | ICD-10-CM

## 2010-07-07 DIAGNOSIS — G4733 Obstructive sleep apnea (adult) (pediatric): Secondary | ICD-10-CM

## 2010-07-11 NOTE — Assessment & Plan Note (Addendum)
Summary: discuss sleep study/ms   Copy to:  Marca Ancona, Karle Plumber Primary Provider/Referring Provider:  Dr. Herb Grays  CC:  Pt here to discuss sleep study results.  History of Present Illness: 70 yo male with GOLD 2 COPD, a. fib on amiodarone, hx of lung cancer and OSA.  He had sleep test done on 06/29/10: AHI 61.4, SpO2 low 67%.  PLMI 0.  He still has some cough and sputum.  He uses ventolin in the morning.  He is not having wheeze, hemoptysis, fever, or chest pain.  He gets occasional ankle swelling.   Current Medications (verified): 1)  Advair Diskus 250-50 Mcg/dose Aepb (Fluticasone-Salmeterol) .... One Puff Two Times A Day As Needed 2)  Meclizine Hcl 25 Mg Tabs (Meclizine Hcl) .... Take 1 Tablet By Mouth Once A Day 3)  Aricept 10 Mg Tabs (Donepezil Hcl) .... Take 1 Tablet By Mouth Once A Day 4)  Depo-Testosterone 200 Mg/ml Oil (Testosterone Cypionate) .... 400 Mg Every 2 Weeks 5)  Furosemide 20 Mg Tabs (Furosemide) .... One Tablet Daily 6)  Klor-Con 10 10 Meq Cr-Tabs (Potassium Chloride) .... One Tablet Daily 7)  Aspirin 81 Mg Tbec (Aspirin) .... Take One Tablet By Mouth Daily 8)  Pradaxa 150 Mg Caps (Dabigatran Etexilate Mesylate) .... Take 1 By Mouth Two Times A Day 9)  Omeprazole 20 Mg Cpdr (Omeprazole) .... Take 1 By Mouth Once Daily 10)  Bisoprolol Fumarate 5 Mg Tabs (Bisoprolol Fumarate) .... One Tablet Daily 11)  Amiodarone Hcl 200 Mg Tabs (Amiodarone Hcl) .... Take One Tablet By Mouth Daily 12)  Hydrocodone-Acetaminophen 5-325 Mg Tabs (Hydrocodone-Acetaminophen) .... Take As Directed As Needed 13)  Diltiazem Hcl Cr 180 Mg Xr24h-Cap (Diltiazem Hcl) .Marland Kitchen.. 1 Once Daily 14)  Fluoxetine Hcl 20 Mg Caps (Fluoxetine Hcl) .... 3 By Mouth Daily 15)  Ventolin Hfa 108 (90 Base) Mcg/act Aers (Albuterol Sulfate) .... Two Puffs Up To Four Times Per Day As Needed  Allergies (verified): No Known Drug Allergies  Past History:  Past Surgical History: Last updated:  02/09/2010 Left upper lobe wedge resection 2008 Low anterior resection of sigmoid and proximal rectum 2008 Bilateral hip replacement  Bilateral knee replacement  Past Medical History: 1. 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 COLON CANCER (ICD-153.9): s/p resection in 2008.  3. OSTEOARTHRITIS (ICD-715.90)     - s/p Bilat. Hip and Knee replacements with redo R Knee replacement and redo left TKR. 4. HYPERTENSION (ICD-401.9) 5. ATRIAL FIBRILLATION, HX OF (ICD-V12.59): post-operative in 2008.  Had another episode in 6/11 in the setting of PNA.  He was started on dabigatran and on amiodarone to maintain NSR.  6. CAD: Adenosine myoview (6/08): EF 56%, fixed inferior defect may have been attenuation.  No evidence for ischemia.  Lexiscan myoview (5/11): EF 45%, lateral hypokinesis, apical thinning with no evidence for ischemia or infarction.  Left heart cath (7/11) with EF 50%, mild global hypokinesis, 50% mid RCA, 40% PLV, 50% ostial ramus.  Moderate nonobstructive disease, medical management.  7. COPD: moderate    - PFT's 04/04/09: FEV1 33%, FEV1% 54    - PFTs 07/11: FEV1 50%, FEV1% 54, TLC 80%, DLCO 69% 8. CKD 9. ? Dementia (he is on Aricept) 10. CHF: Diastolic, echo (8/08) with EF 91%, moderate LVH.  Echo (5/11): EF 55%, mild LV hypertrophy, normal wall motion, mild diastolic dysfunction, mild MR, mild LAE, normal RV  size and systolic function.  RHC (7/11): mean RA 7, PA 29/15, PCWP 8.  11.  RIght upper lobe PNA 6/11 12. OSA       - PSG 06/29/10>>AHI 61  Vital Signs:  Patient profile:   70 year old male Height:      72 inches Weight:      230.50 pounds BMI:     31.37 O2 Sat:      93 % on Room air Temp:     98.2 degrees F oral Pulse rate:   74 / minute BP sitting:   118 / 70  (left arm) Cuff size:   regular  Vitals Entered By: Carver Fila (July 07, 2010 1:52 PM)  O2 Flow:  Room air CC: Pt here to  discuss sleep study results Comments meds and allergies updated Phone number updated Carver Fila  July 07, 2010 1:53 PM    Physical Exam  General:  obese.   Nose:  no deformity, discharge, inflammation, or lesions Mouth:  MP 3, no exudate Neck:  no JVD.   Lungs:  diminished breath sounds, no wheezing or rales, no dullness Heart:  regular rhythm, normal rate, and no murmurs.   Extremities:  trace leg edema, no cyanosis or clubbing Neurologic:  normal CN II-XII and strength normal.   Cervical Nodes:  no significant adenopathy   Impression & Recommendations:  Problem # 1:  OBSTRUCTIVE SLEEP APNEA (ICD-327.23) He has severe sleep apnea.  Results discussed patient.  Explained how sleep apnea can affect his health.  Driving precautions, and weight loss discussed.  Treatment options reviewed.  Will arrange for auto CPAP at home.  If this is unsuccessful, he will need in-lab titration.  Problem # 2:  COPD UNSPECIFIED (ICD-496) He is stable on his current inhaler regimen.  Problem # 3:  CARCINOMA, LUNG, HX OF (ICD-V10.11) Assessment: Unchanged  Problem # 4:  ATRIAL FIBRILLATION, HX OF (ICD-V12.59)  He is on amiodarone and will need f/u PFT July 2012.  Medications Added to Medication List This Visit: 1)  Depo-testosterone 200 Mg/ml Oil (Testosterone cypionate) .... 400 mg every 2 weeks 2)  Fluoxetine Hcl 20 Mg Caps (Fluoxetine hcl) .... 3 by mouth daily  Complete Medication List: 1)  Advair Diskus 250-50 Mcg/dose Aepb (Fluticasone-salmeterol) .... One puff two times a day as needed 2)  Meclizine Hcl 25 Mg Tabs (Meclizine hcl) .... Take 1 tablet by mouth once a day 3)  Aricept 10 Mg Tabs (Donepezil hcl) .... Take 1 tablet by mouth once a day 4)  Depo-testosterone 200 Mg/ml Oil (Testosterone cypionate) .... 400 mg every 2 weeks 5)  Furosemide 20 Mg Tabs (Furosemide) .... One tablet daily 6)  Klor-con 10 10 Meq Cr-tabs (Potassium chloride) .... One tablet daily 7)  Aspirin 81 Mg  Tbec (Aspirin) .... Take one tablet by mouth daily 8)  Pradaxa 150 Mg Caps (Dabigatran etexilate mesylate) .... Take 1 by mouth two times a day 9)  Omeprazole 20 Mg Cpdr (Omeprazole) .... Take 1 by mouth once daily 10)  Bisoprolol Fumarate 5 Mg Tabs (Bisoprolol fumarate) .... One tablet daily 11)  Amiodarone Hcl 200 Mg Tabs (Amiodarone hcl) .... Take one tablet by mouth daily 12)  Hydrocodone-acetaminophen 5-325 Mg Tabs (Hydrocodone-acetaminophen) .... Take as directed as needed 13)  Diltiazem Hcl Cr 180 Mg Xr24h-cap (Diltiazem hcl) .Marland Kitchen.. 1 once daily 14)  Fluoxetine Hcl 20 Mg Caps (Fluoxetine hcl) .... 3 by mouth daily 15)  Ventolin Hfa 108 (90 Base) Mcg/act Aers (Albuterol sulfate) .Marland KitchenMarland KitchenMarland Kitchen  Two puffs up to four times per day as needed  Other Orders: Est. Patient Level IV (95621) DME Referral (DME)  Patient Instructions: 1)  Will arrange for CPAP at home. 2)  Follow up in 2 months.   Immunization History:  Pneumovax Immunization History:    Pneumovax:  historical (02/11/2010)

## 2010-07-11 NOTE — Miscellaneous (Signed)
Summary: Sleep study report   Clinical Lists Changes AHI 61.4, SpO2 low 67%.  PLMI 0.  Will have my nurse schedule ROV to review results.  Appended Document: Sleep study report atc pt but no answer and was unable to leave vm. Will try back later  Appended Document: Sleep study report called and spoke with pt and he is coming in on 2/24 at 2:00

## 2010-07-13 ENCOUNTER — Telehealth: Payer: Self-pay | Admitting: Pulmonary Disease

## 2010-07-25 NOTE — Progress Notes (Signed)
Summary: cpap  Phone Note Call from Patient Call back at [phone]   Caller: Patient Call For: Lucindy Borel Summary of Call: Pt states [facility] hasn't delivered his cpap yet and that he was instructed to call if he didn't hear anything from them pls advise. Initial call taken by: Darletta Moll,  July 13, 2010 1:01 PM  Follow-up for Phone Call        pt saw VS on 07/07/2010 and VS sent order to [facility] to set pt up with a cpap through [facility].  Pt calling today stating he still hasn't been set up with a cpap.  Will forward message to [facility] to address. Aundra Millet Reynolds LPN  July 12, 452 1:02 PM   Spoke with Mayra Reel at [facility] she is going to check on order and return my call. Rhonda Cobb  July 13, 2010 1:59 PM Called and spoke with pt and advised pt that Dr. Craige Cotta would be in the office Friday morning and that I will address this issue at this time. I will return pt's call once I speak with Dr. Craige Cotta. Pt is aware. Alfonso Ramus  July 13, 2010 5:14 PM  Spoke with Buford Dresser at [facility] and Mardelle Matte will call pt and schedule set up for hopefully Monday 07/17/10. Called and spoke with pt and he is aware that [facility] will be calling. Pt has been advised to call me on Monday if he hasn't heard anything from [facility].  Rhonda Cobb  July 14, 2010 5:21 PM' Per Mardelle Matte at [facility]. CPAP is approved and that they will contact pt tomorrow to schedule. Pt is aware and aware if he doesn't hear from [facility] to let me know. Rhonda Cobb  July 17, 2010 5:44 PM  Pt was contacted last night by [facility] staff and is scheduled to have CPAP set up today (Tues) 07/18/10 at 11:[address] on [address]. Pt is aware of appt. Rhonda Cobb  July 18, 2010 10:12 AM F/U appt with Dr. Craige Cotta was scheduled for 09/05/10. Rhonda Cobb  July 18, 2010 10:14 AM

## 2010-07-26 ENCOUNTER — Encounter: Payer: Self-pay | Admitting: Pulmonary Disease

## 2010-07-29 LAB — POCT I-STAT 3, ART BLOOD GAS (G3+)
Acid-Base Excess: 4 mmol/L — ABNORMAL HIGH (ref 0.0–2.0)
Bicarbonate: 28.2 mEq/L — ABNORMAL HIGH (ref 20.0–24.0)
O2 Saturation: 95 %
TCO2: 29 mmol/L (ref 0–100)
pCO2 arterial: 38.7 mmHg (ref 35.0–45.0)
pH, Arterial: 7.471 — ABNORMAL HIGH (ref 7.350–7.450)

## 2010-07-29 LAB — POCT I-STAT 3, VENOUS BLOOD GAS (G3P V)
Acid-Base Excess: 3 mmol/L — ABNORMAL HIGH (ref 0.0–2.0)
O2 Saturation: 53 %
TCO2: 31 mmol/L (ref 0–100)
pCO2, Ven: 53 mmHg — ABNORMAL HIGH (ref 45.0–50.0)

## 2010-07-30 LAB — CBC
HCT: 28.9 % — ABNORMAL LOW (ref 39.0–52.0)
HCT: 32.8 % — ABNORMAL LOW (ref 39.0–52.0)
Hemoglobin: 11.1 g/dL — ABNORMAL LOW (ref 13.0–17.0)
Hemoglobin: 11.8 g/dL — ABNORMAL LOW (ref 13.0–17.0)
Hemoglobin: 13.9 g/dL (ref 13.0–17.0)
MCHC: 33.2 g/dL (ref 30.0–36.0)
MCHC: 33.6 g/dL (ref 30.0–36.0)
MCV: 88.3 fL (ref 78.0–100.0)
MCV: 89.1 fL (ref 78.0–100.0)
MCV: 89.2 fL (ref 78.0–100.0)
MCV: 90.9 fL (ref 78.0–100.0)
Platelets: 151 10*3/uL (ref 150–400)
Platelets: 155 10*3/uL (ref 150–400)
Platelets: 201 10*3/uL (ref 150–400)
Platelets: 207 10*3/uL (ref 150–400)
RBC: 2.79 MIL/uL — ABNORMAL LOW (ref 4.22–5.81)
RBC: 3.94 MIL/uL — ABNORMAL LOW (ref 4.22–5.81)
RBC: 4.72 MIL/uL (ref 4.22–5.81)
RDW: 14.7 % (ref 11.5–15.5)
RDW: 14.7 % (ref 11.5–15.5)
RDW: 14.9 % (ref 11.5–15.5)
RDW: 15 % (ref 11.5–15.5)
WBC: 5.1 10*3/uL (ref 4.0–10.5)
WBC: 5.9 10*3/uL (ref 4.0–10.5)
WBC: 6.1 10*3/uL (ref 4.0–10.5)

## 2010-07-30 LAB — RENAL FUNCTION PANEL
Albumin: 2.7 g/dL — ABNORMAL LOW (ref 3.5–5.2)
Calcium: 7.2 mg/dL — ABNORMAL LOW (ref 8.4–10.5)
Creatinine, Ser: 1.94 mg/dL — ABNORMAL HIGH (ref 0.4–1.5)
GFR calc Af Amer: 42 mL/min — ABNORMAL LOW (ref >60)
GFR calc non Af Amer: 35 mL/min — ABNORMAL LOW (ref >60)
Phosphorus: 2.2 mg/dL — ABNORMAL LOW (ref 2.3–4.6)
Sodium: 136 mEq/L (ref 135–145)

## 2010-07-30 LAB — DIFFERENTIAL
Basophils Absolute: 0 10*3/uL (ref 0.0–0.1)
Basophils Relative: 0 % (ref 0–1)
Basophils Relative: 1 % (ref 0–1)
Eosinophils Absolute: 0.1 10*3/uL (ref 0.0–0.7)
Eosinophils Relative: 5 % (ref 0–5)
Lymphocytes Relative: 19 % (ref 12–46)
Lymphs Abs: 1 10*3/uL (ref 0.7–4.0)
Monocytes Absolute: 0.6 10*3/uL (ref 0.1–1.0)
Monocytes Relative: 13 % — ABNORMAL HIGH (ref 3–12)
Monocytes Relative: 9 % (ref 3–12)
Neutro Abs: 3.2 10*3/uL (ref 1.7–7.7)
Neutro Abs: 6.9 10*3/uL (ref 1.7–7.7)
Neutrophils Relative %: 55 % (ref 43–77)
Neutrophils Relative %: 80 % — ABNORMAL HIGH (ref 43–77)

## 2010-07-30 LAB — GLUCOSE, CAPILLARY
Glucose-Capillary: 101 mg/dL — ABNORMAL HIGH (ref 70–99)
Glucose-Capillary: 101 mg/dL — ABNORMAL HIGH (ref 70–99)
Glucose-Capillary: 106 mg/dL — ABNORMAL HIGH (ref 70–99)
Glucose-Capillary: 120 mg/dL — ABNORMAL HIGH (ref 70–99)
Glucose-Capillary: 123 mg/dL — ABNORMAL HIGH (ref 70–99)
Glucose-Capillary: 131 mg/dL — ABNORMAL HIGH (ref 70–99)
Glucose-Capillary: 149 mg/dL — ABNORMAL HIGH (ref 70–99)
Glucose-Capillary: 157 mg/dL — ABNORMAL HIGH (ref 70–99)
Glucose-Capillary: 91 mg/dL (ref 70–99)
Glucose-Capillary: 99 mg/dL (ref 70–99)

## 2010-07-30 LAB — COMPREHENSIVE METABOLIC PANEL
ALT: 21 U/L (ref 0–53)
AST: 22 U/L (ref 0–37)
AST: 23 U/L (ref 0–37)
AST: 31 U/L (ref 0–37)
Albumin: 2.5 g/dL — ABNORMAL LOW (ref 3.5–5.2)
Albumin: 2.6 g/dL — ABNORMAL LOW (ref 3.5–5.2)
Albumin: 2.7 g/dL — ABNORMAL LOW (ref 3.5–5.2)
Alkaline Phosphatase: 60 U/L (ref 39–117)
Alkaline Phosphatase: 70 U/L (ref 39–117)
BUN: 32 mg/dL — ABNORMAL HIGH (ref 6–23)
BUN: 35 mg/dL — ABNORMAL HIGH (ref 6–23)
CO2: 28 mEq/L (ref 19–32)
CO2: 31 mEq/L (ref 19–32)
Calcium: 7.4 mg/dL — ABNORMAL LOW (ref 8.4–10.5)
Calcium: 7.9 mg/dL — ABNORMAL LOW (ref 8.4–10.5)
Chloride: 100 mEq/L (ref 96–112)
Chloride: 101 mEq/L (ref 96–112)
Chloride: 98 mEq/L (ref 96–112)
Creatinine, Ser: 1.4 mg/dL (ref 0.4–1.5)
Creatinine, Ser: 1.43 mg/dL (ref 0.4–1.5)
Creatinine, Ser: 1.44 mg/dL (ref 0.4–1.5)
Creatinine, Ser: 1.52 mg/dL — ABNORMAL HIGH (ref 0.4–1.5)
GFR calc Af Amer: 59 mL/min — ABNORMAL LOW (ref >60)
GFR calc Af Amer: 60 mL/min — ABNORMAL LOW (ref >60)
GFR calc Af Amer: >60 mL/min (ref >60)
GFR calc non Af Amer: 49 mL/min — ABNORMAL LOW (ref >60)
GFR calc non Af Amer: 53 mL/min — ABNORMAL LOW (ref >60)
Glucose, Bld: 108 mg/dL — ABNORMAL HIGH (ref 70–99)
Glucose, Bld: 112 mg/dL — ABNORMAL HIGH (ref 70–99)
Potassium: 3.8 mEq/L (ref 3.5–5.1)
Potassium: 3.8 mEq/L (ref 3.5–5.1)
Sodium: 139 mEq/L (ref 135–145)
Total Bilirubin: 0.7 mg/dL (ref 0.3–1.2)
Total Bilirubin: 0.9 mg/dL (ref 0.3–1.2)
Total Bilirubin: 1 mg/dL (ref 0.3–1.2)
Total Bilirubin: 1.1 mg/dL (ref 0.3–1.2)
Total Protein: 5.3 g/dL — ABNORMAL LOW (ref 6.0–8.3)
Total Protein: 6.1 g/dL (ref 6.0–8.3)

## 2010-07-30 LAB — BASIC METABOLIC PANEL
BUN: 32 mg/dL — ABNORMAL HIGH (ref 6–23)
BUN: 50 mg/dL — ABNORMAL HIGH (ref 6–23)
CO2: 24 mEq/L (ref 19–32)
CO2: 29 mEq/L (ref 19–32)
CO2: 29 mEq/L (ref 19–32)
Calcium: 7.2 mg/dL — ABNORMAL LOW (ref 8.4–10.5)
Calcium: 7.8 mg/dL — ABNORMAL LOW (ref 8.4–10.5)
Chloride: 100 mEq/L (ref 96–112)
Creatinine, Ser: 1.9 mg/dL — ABNORMAL HIGH (ref 0.4–1.5)
GFR calc Af Amer: 31 mL/min — ABNORMAL LOW (ref >60)
GFR calc Af Amer: 34 mL/min — ABNORMAL LOW (ref >60)
GFR calc Af Amer: 43 mL/min — ABNORMAL LOW (ref >60)
GFR calc non Af Amer: 26 mL/min — ABNORMAL LOW (ref >60)
GFR calc non Af Amer: 28 mL/min — ABNORMAL LOW (ref >60)
Glucose, Bld: 131 mg/dL — ABNORMAL HIGH (ref 70–99)
Glucose, Bld: 140 mg/dL — ABNORMAL HIGH (ref 70–99)
Glucose, Bld: 154 mg/dL — ABNORMAL HIGH (ref 70–99)
Potassium: 3.8 mEq/L (ref 3.5–5.1)
Potassium: 5.3 mEq/L — ABNORMAL HIGH (ref 3.5–5.1)
Sodium: 134 mEq/L — ABNORMAL LOW (ref 135–145)
Sodium: 135 mEq/L (ref 135–145)

## 2010-07-30 LAB — BLOOD GAS, ARTERIAL
Acid-Base Excess: 0.9 mmol/L (ref 0.0–2.0)
Acid-Base Excess: 2.9 mmol/L — ABNORMAL HIGH (ref 0.0–2.0)
Bicarbonate: 24.8 mEq/L — ABNORMAL HIGH (ref 20.0–24.0)
O2 Saturation: 93.6 %
Patient temperature: 98.4
TCO2: 22.3 mmol/L (ref 0–100)
TCO2: 24.5 mmol/L (ref 0–100)
pCO2 arterial: 43.7 mmHg (ref 35.0–45.0)
pO2, Arterial: 156 mmHg — ABNORMAL HIGH (ref 80.0–100.0)

## 2010-07-30 LAB — CROSSMATCH

## 2010-07-30 LAB — CK TOTAL AND CKMB (NOT AT ARMC)
CK, MB: 3.4 ng/mL (ref 0.3–4.0)
Relative Index: 1.2 (ref 0.0–2.5)
Relative Index: 1.2 (ref 0.0–2.5)
Relative Index: 1.3 (ref 0.0–2.5)
Total CK: 269 U/L — ABNORMAL HIGH (ref 7–232)
Total CK: 341 U/L — ABNORMAL HIGH (ref 7–232)
Total CK: 420 U/L — ABNORMAL HIGH (ref 7–232)

## 2010-07-30 LAB — URINALYSIS, ROUTINE W REFLEX MICROSCOPIC
Protein, ur: NEGATIVE mg/dL
Urobilinogen, UA: 0.2 mg/dL (ref 0.0–1.0)
pH: 5.5 (ref 5.0–8.0)

## 2010-07-30 LAB — PROTIME-INR
INR: 0.95 (ref 0.00–1.49)
INR: 1.08 (ref 0.00–1.49)
Prothrombin Time: 13.9 seconds (ref 11.6–15.2)

## 2010-07-30 LAB — C4 COMPLEMENT: Complement C4, Body Fluid: 17 mg/dL (ref 16–47)

## 2010-07-30 LAB — MAGNESIUM: Magnesium: 1.9 mg/dL (ref 1.5–2.5)

## 2010-07-30 LAB — ANAEROBIC CULTURE: Gram Stain: NONE SEEN

## 2010-07-30 LAB — GRAM STAIN

## 2010-07-30 LAB — BRAIN NATRIURETIC PEPTIDE: Pro B Natriuretic peptide (BNP): 419 pg/mL — ABNORMAL HIGH (ref 0.0–100.0)

## 2010-07-30 LAB — IRON AND TIBC
Saturation Ratios: 13 % — ABNORMAL LOW (ref 20–55)
TIBC: 289 ug/dL (ref 215–435)

## 2010-07-30 LAB — CARDIAC PANEL(CRET KIN+CKTOT+MB+TROPI)
Relative Index: 1.9 (ref 0.0–2.5)
Total CK: 134 U/L (ref 7–232)
Troponin I: 0.05 ng/mL (ref 0.00–0.06)

## 2010-07-30 LAB — TROPONIN I
Troponin I: 0.06 ng/mL (ref 0.00–0.06)
Troponin I: 0.07 ng/mL — ABNORMAL HIGH (ref 0.00–0.06)

## 2010-07-30 LAB — T3: T3, Total: 58.8 ng/dl — ABNORMAL LOW (ref 80.0–204.0)

## 2010-07-30 LAB — PTH, INTACT AND CALCIUM
Calcium, Total (PTH): 7.4 mg/dL — ABNORMAL LOW (ref 8.4–10.5)
PTH: 314 pg/mL — ABNORMAL HIGH (ref 14.0–72.0)

## 2010-07-30 LAB — SEDIMENTATION RATE: Sed Rate: 23 mm/hr — ABNORMAL HIGH (ref 0–16)

## 2010-07-30 LAB — CALCIUM: Calcium: 7.1 mg/dL — ABNORMAL LOW (ref 8.4–10.5)

## 2010-07-30 LAB — T4: T4, Total: 3.6 ug/dL — ABNORMAL LOW (ref 5.0–12.5)

## 2010-07-31 LAB — CARDIAC PANEL(CRET KIN+CKTOT+MB+TROPI)
CK, MB: 2.6 ng/mL (ref 0.3–4.0)
CK, MB: 2.9 ng/mL (ref 0.3–4.0)
CK, MB: 4.1 ng/mL — ABNORMAL HIGH (ref 0.3–4.0)
CK, MB: 4.1 ng/mL — ABNORMAL HIGH (ref 0.3–4.0)
CK, MB: 9.1 ng/mL (ref 0.3–4.0)
Relative Index: 3 — ABNORMAL HIGH (ref 0.0–2.5)
Relative Index: 6 — ABNORMAL HIGH (ref 0.0–2.5)
Relative Index: INVALID (ref 0.0–2.5)
Total CK: 135 U/L (ref 7–232)
Total CK: 150 U/L (ref 7–232)
Total CK: 29 U/L (ref 7–232)
Troponin I: 0.02 ng/mL (ref 0.00–0.06)
Troponin I: 0.03 ng/mL (ref 0.00–0.06)
Troponin I: 0.04 ng/mL (ref 0.00–0.06)
Troponin I: 0.11 ng/mL — ABNORMAL HIGH (ref 0.00–0.06)

## 2010-07-31 LAB — BASIC METABOLIC PANEL
CO2: 22 mEq/L (ref 19–32)
CO2: 27 mEq/L (ref 19–32)
CO2: 27 mEq/L (ref 19–32)
Calcium: 8.7 mg/dL (ref 8.4–10.5)
Calcium: 9 mg/dL (ref 8.4–10.5)
Chloride: 102 mEq/L (ref 96–112)
Creatinine, Ser: 1.42 mg/dL (ref 0.4–1.5)
GFR calc Af Amer: 58 mL/min — ABNORMAL LOW (ref >60)
GFR calc Af Amer: >60 mL/min (ref >60)
GFR calc non Af Amer: 50 mL/min — ABNORMAL LOW (ref >60)
Glucose, Bld: 256 mg/dL — ABNORMAL HIGH (ref 70–99)
Glucose, Bld: 113 mg/dL — ABNORMAL HIGH (ref 70–99)
Potassium: 4.1 mEq/L (ref 3.5–5.1)
Potassium: 4.2 mEq/L (ref 3.5–5.1)
Sodium: 137 mEq/L (ref 135–145)
Sodium: 138 mEq/L (ref 135–145)
Sodium: 138 mEq/L (ref 135–145)

## 2010-07-31 LAB — CBC
HCT: 41.1 % (ref 39.0–52.0)
Hemoglobin: 13.3 g/dL (ref 13.0–17.0)
Hemoglobin: 14.5 g/dL (ref 13.0–17.0)
Hemoglobin: 14 g/dL (ref 13.0–17.0)
MCHC: 33.7 g/dL (ref 30.0–36.0)
MCHC: 34.1 g/dL (ref 30.0–36.0)
MCHC: 34.7 g/dL (ref 30.0–36.0)
MCHC: 34.1 g/dL (ref 30.0–36.0)
MCV: 87.5 fL (ref 78.0–100.0)
Platelets: 204 10*3/uL (ref 150–400)
RBC: 4.09 MIL/uL — ABNORMAL LOW (ref 4.22–5.81)
RBC: 4.27 MIL/uL (ref 4.22–5.81)
RDW: 14.9 % (ref 11.5–15.5)
RDW: 15.3 % (ref 11.5–15.5)
RDW: 14 % (ref 11.5–15.5)
WBC: 15.5 10*3/uL — ABNORMAL HIGH (ref 4.0–10.5)

## 2010-07-31 LAB — GLUCOSE, CAPILLARY
Glucose-Capillary: 102 mg/dL — ABNORMAL HIGH (ref 70–99)
Glucose-Capillary: 109 mg/dL — ABNORMAL HIGH (ref 70–99)
Glucose-Capillary: 115 mg/dL — ABNORMAL HIGH (ref 70–99)
Glucose-Capillary: 120 mg/dL — ABNORMAL HIGH (ref 70–99)
Glucose-Capillary: 127 mg/dL — ABNORMAL HIGH (ref 70–99)
Glucose-Capillary: 131 mg/dL — ABNORMAL HIGH (ref 70–99)
Glucose-Capillary: 176 mg/dL — ABNORMAL HIGH (ref 70–99)
Glucose-Capillary: 179 mg/dL — ABNORMAL HIGH (ref 70–99)
Glucose-Capillary: 185 mg/dL — ABNORMAL HIGH (ref 70–99)
Glucose-Capillary: 187 mg/dL — ABNORMAL HIGH (ref 70–99)
Glucose-Capillary: 205 mg/dL — ABNORMAL HIGH (ref 70–99)
Glucose-Capillary: 232 mg/dL — ABNORMAL HIGH (ref 70–99)
Glucose-Capillary: 266 mg/dL — ABNORMAL HIGH (ref 70–99)
Glucose-Capillary: 287 mg/dL — ABNORMAL HIGH (ref 70–99)
Glucose-Capillary: 319 mg/dL — ABNORMAL HIGH (ref 70–99)
Glucose-Capillary: 88 mg/dL (ref 70–99)

## 2010-07-31 LAB — DIFFERENTIAL
Basophils Relative: 1 % (ref 0–1)
Eosinophils Absolute: 0 10*3/uL (ref 0.0–0.7)
Monocytes Absolute: 1 10*3/uL (ref 0.1–1.0)
Neutro Abs: 11.5 10*3/uL — ABNORMAL HIGH (ref 1.7–7.7)

## 2010-07-31 LAB — LEGIONELLA ANTIGEN, URINE: Legionella Antigen, Urine: NEGATIVE

## 2010-07-31 LAB — CULTURE, BLOOD (ROUTINE X 2): Culture: NO GROWTH

## 2010-07-31 LAB — COMPREHENSIVE METABOLIC PANEL
ALT: 33 U/L (ref 0–53)
Calcium: 8.2 mg/dL — ABNORMAL LOW (ref 8.4–10.5)
Glucose, Bld: 101 mg/dL — ABNORMAL HIGH (ref 70–99)
Sodium: 137 mEq/L (ref 135–145)
Total Protein: 5.3 g/dL — ABNORMAL LOW (ref 6.0–8.3)

## 2010-07-31 LAB — HEMOGLOBIN A1C: Mean Plasma Glucose: 140 mg/dL — ABNORMAL HIGH (ref <117)

## 2010-07-31 LAB — STREP PNEUMONIAE URINARY ANTIGEN: Strep Pneumo Urinary Antigen: NEGATIVE

## 2010-08-10 NOTE — Miscellaneous (Signed)
Summary: CPAP RT Set Up / [facility]  CPAP RT Set Up / [facility]   Imported By: Lennie Odor 07/31/2010 14:32:09  _____________________________________________________________________  External Attachment:    Type:   Image     Comment:   External Document

## 2010-08-24 ENCOUNTER — Encounter: Payer: Self-pay | Admitting: Pulmonary Disease

## 2010-09-01 ENCOUNTER — Encounter: Payer: Self-pay | Admitting: Pulmonary Disease

## 2010-09-05 ENCOUNTER — Encounter: Payer: Self-pay | Admitting: Pulmonary Disease

## 2010-09-05 ENCOUNTER — Ambulatory Visit (INDEPENDENT_AMBULATORY_CARE_PROVIDER_SITE_OTHER): Payer: Medicare Other | Admitting: Pulmonary Disease

## 2010-09-05 VITALS — BP 124/62 | HR 69 | Temp 98.2°F | Ht 72.0 in | Wt 237.0 lb

## 2010-09-05 DIAGNOSIS — Z8679 Personal history of other diseases of the circulatory system: Secondary | ICD-10-CM

## 2010-09-05 DIAGNOSIS — F32A Depression, unspecified: Secondary | ICD-10-CM | POA: Insufficient documentation

## 2010-09-05 DIAGNOSIS — I2510 Atherosclerotic heart disease of native coronary artery without angina pectoris: Secondary | ICD-10-CM

## 2010-09-05 DIAGNOSIS — I509 Heart failure, unspecified: Secondary | ICD-10-CM

## 2010-09-05 DIAGNOSIS — Z85118 Personal history of other malignant neoplasm of bronchus and lung: Secondary | ICD-10-CM

## 2010-09-05 DIAGNOSIS — F329 Major depressive disorder, single episode, unspecified: Secondary | ICD-10-CM | POA: Insufficient documentation

## 2010-09-05 DIAGNOSIS — J449 Chronic obstructive pulmonary disease, unspecified: Secondary | ICD-10-CM

## 2010-09-05 DIAGNOSIS — G4733 Obstructive sleep apnea (adult) (pediatric): Secondary | ICD-10-CM

## 2010-09-05 DIAGNOSIS — I5032 Chronic diastolic (congestive) heart failure: Secondary | ICD-10-CM

## 2010-09-05 NOTE — Assessment & Plan Note (Signed)
Followed by Dr. Edwyna Shell after Lt upper lobe resection and radiation seed insertion in 2008.

## 2010-09-05 NOTE — Patient Instructions (Signed)
Will schedule breathing test (PFT) and call with results Will set CPAP at 11 cm H2O Follow up in 4 to 6 months

## 2010-09-05 NOTE — Assessment & Plan Note (Signed)
He is stable on his current inhaler regimen.

## 2010-09-05 NOTE — Assessment & Plan Note (Signed)
He has done well with CPAP, and I have encouraged him to maintain his compliance.

## 2010-09-05 NOTE — Assessment & Plan Note (Signed)
Has been on amiodarone since June 2011.  Will repeat PFT's and call with results.

## 2010-09-05 NOTE — Progress Notes (Signed)
Subjective:    Patient ID: [name], male    DOB: [birth year], 70 y.o.   MRN: [id number]  Copy to:  Marca Ancona, Lonzo Candy HPI 70 yo male with GOLD 2 COPD, a. fib on amiodarone, hx of lung cancer and OSA.  Since his last visit he was started on CPAP.  He is using nasal pillows.  He has done well with this.  His recent download showed excellent compliance.  He feels like he is sleeping better.  He still feels run down during the day.  He thinks some of this may be related to feeling depressed with frequent mood swings.  He is also concerned about whether some of his medications may be causing this feeling.  His breathing is okay.  He gets winded occasionally when he exerts himself too much.  He does not have much cough, but will bring up clear sputum.  He has not needed to use ventolin that much.  Review of Systems     Objective:   Physical Exam Filed Vitals:   09/05/10 1147 09/05/10 1150  BP:  124/62  Pulse:  69  Temp: 98.2 F (36.8 C)   TempSrc: Oral   Height: 6' (1.829 m)   Weight: 237 lb (107.502 kg)   SpO2:  93%   General - Healthy, no distress HEENT - no sinus tenderness, no oral exudate, no LAN Cardiac - S1S2 regular, no murmur Chest - dimished breath sounds, mid lung field rhonchi clear with cough, no wheeze/rales Abd - soft, non-tender, normal bowel sounds Ext - no edema Neuro - normal strength, A&O x 3, CN intact Psych - depressed affect        Assessment & Plan:   OBSTRUCTIVE SLEEP APNEA He has done well with CPAP, and I have encouraged him to maintain his compliance.  COPD UNSPECIFIED He is stable on his current inhaler regimen.  CARCINOMA, LUNG, HX OF Followed by Dr. Edwyna Shell after Lt upper lobe resection and radiation seed insertion in 2008.  ATRIAL FIBRILLATION, HX OF Has been on amiodarone since June 2011.  Will repeat PFT's and call with results.    Depression He reports mood swings and feeling run down.  I am concerned some  of this could be related to his depression.  Have asked him to discuss further with primary care.    Updated Medication List Outpatient Encounter Prescriptions as of 09/05/2010  Medication Sig Dispense Refill  . albuterol (VENTOLIN HFA) 108 (90 BASE) MCG/ACT inhaler Inhale 2 puffs into the lungs every 6 (six) hours as needed.        Marland Kitchen amiodarone (PACERONE) 200 MG tablet Take 200 mg by mouth daily.        Marland Kitchen aspirin 81 MG tablet Take 81 mg by mouth daily.        . bisoprolol (ZEBETA) 5 MG tablet Take 5 mg by mouth daily.        . dabigatran (PRADAXA) 150 MG CAPS Take 150 mg by mouth every 12 (twelve) hours.        Marland Kitchen diltiazem (TIAZAC) 180 MG 24 hr capsule Take 180 mg by mouth daily.        Marland Kitchen donepezil (ARICEPT) 10 MG tablet Take 10 mg by mouth at bedtime as needed.        Marland Kitchen FLUoxetine (PROZAC) 20 MG tablet Take 3 by mouth daily       . Fluticasone-Salmeterol (ADVAIR DISKUS) 250-50 MCG/DOSE AEPB Inhale 1 puff into the lungs every  12 (twelve) hours.        Marland Kitchen HYDROcodone-acetaminophen (NORCO) 5-325 MG per tablet Take 1 tablet by mouth every 6 (six) hours as needed.        . meclizine (ANTIVERT) 25 MG tablet Take 25 mg by mouth daily.        Marland Kitchen omeprazole (PRILOSEC) 20 MG capsule Take 20 mg by mouth daily.        . potassium chloride (KLOR-CON) 10 MEQ CR tablet Take 10 mEq by mouth daily.        Marland Kitchen testosterone cypionate (DEPOTESTOTERONE CYPIONATE) 200 MG/ML injection Inject into the muscle. 400 mg every 14 days       . DISCONTD: furosemide (LASIX) 20 MG tablet Take 20 mg by mouth daily.

## 2010-09-05 NOTE — Assessment & Plan Note (Signed)
He reports mood swings and feeling run down.  I am concerned some of this could be related to his depression.  Have asked him to discuss further with primary care.

## 2010-09-11 ENCOUNTER — Encounter: Payer: Self-pay | Admitting: Pulmonary Disease

## 2010-09-12 ENCOUNTER — Other Ambulatory Visit: Payer: Self-pay | Admitting: Thoracic Surgery

## 2010-09-12 DIAGNOSIS — C3490 Malignant neoplasm of unspecified part of unspecified bronchus or lung: Secondary | ICD-10-CM

## 2010-09-15 ENCOUNTER — Telehealth: Payer: Self-pay | Admitting: Pulmonary Disease

## 2010-09-15 ENCOUNTER — Ambulatory Visit (INDEPENDENT_AMBULATORY_CARE_PROVIDER_SITE_OTHER): Payer: Medicare Other | Admitting: Pulmonary Disease

## 2010-09-15 DIAGNOSIS — R0609 Other forms of dyspnea: Secondary | ICD-10-CM

## 2010-09-15 DIAGNOSIS — J449 Chronic obstructive pulmonary disease, unspecified: Secondary | ICD-10-CM

## 2010-09-15 DIAGNOSIS — R0989 Other specified symptoms and signs involving the circulatory and respiratory systems: Secondary | ICD-10-CM

## 2010-09-15 LAB — PULMONARY FUNCTION TEST

## 2010-09-15 NOTE — Telephone Encounter (Signed)
Received request from [facility] for office note from 09/05/10 to support continued use of CPAP therapy.  Will have my nurse fax note from 09/05/10 to [phone], ATTN: Care Team.

## 2010-09-15 NOTE — Progress Notes (Signed)
PFT done today. 

## 2010-09-15 NOTE — Telephone Encounter (Signed)
Ov note printed off from 09/05/10 and faxed to [phone]. Fax went through

## 2010-09-26 NOTE — Letter (Signed)
February 06, 2007   Tammy R. Collins Scotland, M.D.  [address] [place]  [address]   Re:  LEEMAN, JOHNSEY                 DOB:  12/18/1940   Dear Babette Relic:   I saw Mr. Francesco Sor in the office today and reviewed his PET scan and  his pulmonary function tests as well as his MRI of the brain.  Brain  scan was negative for multiple cavernous malformation syndrome.  His PET  scan was positive for the left upper lobe lesion but negative for the  right lower lobe lesion and his pulmonary function tests showed a  moderate obstructive pulmonary disease with an FVC of 3.36 with FEV-1 of  1.98.   I have discussed the situation with him and recommended that he have a  left VATS with wedge resection and radioactive seed implantation and  referred him to Dr. Margaretmary Bayley for evaluation.  I think this would be  the best procedure for him given his chronic obstructive pulmonary  disease and there is also a history of colon cancer since I feel this is  probably an early lung cancer.  We plan to do this on October 16 at [facility].  I appreciate the opportunity of seeing Mr. Dunker.   Ines Bloomer, M.D.  Electronically Signed   DPB/MEDQ  D:  02/06/2007  T:  02/07/2007  Job:  [id number]   cc:   Ardeth Sportsman, MD  Artist Pais Kathrynn Running, M.D.

## 2010-09-26 NOTE — Assessment & Plan Note (Signed)
OFFICE VISIT   [name], [name]  DOB:  February 09, 1941                                        March 24, 2008  CHART #:  [id number]   The patient's blood pressure was 100/60, pulse 94, respirations 18, and  sats were 94%.  CT scan in 1 year since his surgery for a stage IA left  upper lobe lesion with seed implantation showed no evidence of  recurrent.  I will plan to see him back again in 6 months to repeat his  CT scan.  He is doing well overall.   Ines Bloomer, M.D.  Electronically Signed   DPB/MEDQ  D:  03/24/2008  T:  03/24/2008  Job:  [id number]   cc:   Artist Pais Kathrynn Running, M.D.

## 2010-09-26 NOTE — Op Note (Signed)
[name], [name]                 ACCOUNT NO.:  [id number]   MEDICAL RECORD NO.:  [id number]          PATIENT TYPE:  INP   LOCATION:  3306                         FACILITY:  [facility]   PHYSICIAN:  Ines Bloomer, M.D. DATE OF BIRTH:  [birth year]   DATE OF PROCEDURE:  DATE OF DISCHARGE:                               OPERATIVE REPORT   PREOPERATIVE DIAGNOSIS:  Left upper lobe lesion.   POSTOPERATIVE DIAGNOSIS:  Nonsmall cell lung cancer, left upper lobe.   OPERATION PERFORMED:  Left video assisted thoracic surgery main  thoracotomy wedge resection, left upper lobe, with seed implantation.   SURGEON:  Ines Bloomer, M.D.   ASSISTANT:  Coral Ceo, P.A.   General anesthesia for percutaneous insertion while monitoring lines,  the patient underwent general anesthesia, was prepped and draped in the  usual sterile manner.  He was turned to the left lateral thoracotomy  position.  A dual lumen tube was inserted.  Two trocar sites were made  in the anterior and posterior mid axillary lines at the 7th intercostal  space.  Two trocars were inserted.  A lesion seen in the posterior  segment of left upper lobe and 7 cm incision was made over the triangle  of auscultation, partially divided the latissimus and entered the 5th  intercostal space, putting a small 2 PA in the space.  The fissure was  then partially divided with the auto-suture 45 reticular and then we  resected the posterior segment of the left upper lobe with the auto-  suture 60 stapler and 45 stapler.  We sent for frozen section and found  to be nonsmall cell lung cancer.  We then did an extensive node  dissection, dissecting down some 4L nodes from the bronchus, an 11L node  from around the upper lobe bronchus an then a 10L node from around the  upper lobe bronchus as well as dissecting in the aortopulmonary window  dissecting out some 5 nodes.  CoSeal was applied to the staple lines.  Three stitches were placed along the  staple line with the middle  stitches being 4-0 Prolene to sew in a coronary ring and then was placed  through the mesh and the mesh sutured in place.  The lateral portion of  the mesh was clipped with Liga clips to flatten it in the fissure.  The  lung was expanded and it flattened easily in the fissure.  Two chest  tubes were brought in through the trocar sites.  A Marcaine block was  done in the usual fashion.  A single On-Q was inserted in the usual  fashion.  The chest was closed with 2 pericostals, #1 Vicryl in the  muscle layer, 2-0 Vicryl in the subcutaneous tissue and Dermabond for  the skin.  The patient tolerated the procedure well, was returned to the  recovery room in stable condition.      Ines Bloomer, M.D.  Electronically Signed     DPB/MEDQ  D:  03/11/2007  T:  03/11/2007  Job:  [id number]   cc:   Ines Bloomer, M.D.

## 2010-09-26 NOTE — Discharge Summary (Signed)
NAMESYLUS, STGERMAIN                 ACCOUNT NO.:  [id number]   MEDICAL RECORD NO.:  [id number]          PATIENT TYPE:  INP   LOCATION:  3306                         FACILITY:  [facility]   PHYSICIAN:  Ines Bloomer, M.D. DATE OF BIRTH:  04/12/1941   DATE OF ADMISSION:  03/11/2007  DATE OF DISCHARGE:                               DISCHARGE SUMMARY   PRIMARY ADMITTING DIAGNOSIS:  Left upper lobe lung lesion.   ADDITIONAL/DISCHARGE DIAGNOSES:  1. 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  2. Postoperative atrial fibrillation.  3. History of hypertension.  4. History of tobacco abuse.  5. History of colon cancer.  6. History of congestive heart failure.  7. Chronic obstructive pulmonary disease.   PROCEDURES PERFORMED:  Left VATS, left mini thoracotomy with left upper  lobe wedge resection and radioactive seed implantation.   HISTORY:  The patient is a 70 year old male with a known history of  colon cancer which was resected by Dr. Michaell Cowing in March 2008.  At that  time he had a CT scans of the chest which showed left upper lobe and  right lower lobe lesion.  A followup CT showed an increase in the left  upper lobe lesion's size with an unchanged right lower lobe lesion.  PET  scan was performed which was positive in the left upper lobe but  negative in the right lower lobe lesion.  He was referred to Dr. Edwyna Shell  for evaluation and it was felt that he should undergo resection with  seed implantation at this time.  He explained the risks, benefits and  alternatives to the patient and he agreed to proceed with surgery.   HOSPITAL COURSE:  Mr. Wampole was admitted to [facility] on  March 11, 2007, and was taken to the operating room where he underwent  a left lower lobe wedge resection with seed implantation as described in  detail above.  He tolerated the procedures well.  He was transferred to  the floor in stable condition.   Postoperatively, he developed atrial  fibrillation on postoperative day #1 and was started on amiodarone and  Lopressor.  He responded well and converted to normal sinus rhythm.  Since that time he has remained in sinus rhythm and has subsequently  been switched to a p.o. dose of amiodarone.  Also, his Lopressor has  been discontinued and he has been restarted on his home dose of Coreg.  Otherwise, he has remained stable during this admission.  His pulmonary  status is improving.  He is being treated with aggressive pulmonary  toilet measures, nebulizer treatments and incentive spirometry, as well  as some diuresis and presently is being weaned from supplemental oxygen.  He is tolerating a regular diet and is ambulating in the halls without  problem.  His chest tubes have been discontinued and a followup chest x-  ray is pending at this time.  He has remained afebrile and all vital  signs have been stable.  His labs on postoperative day #3 show  hemoglobin of  10.5, hematocrit 31.1, white count 8.4, platelets 220,  potassium 4.8, BUN 20, creatinine 0.94.  His pathology revealed invasive  adenocarcinoma (stage T1 N0 MX).  It is felt that if he continues to  progress well and his heart rhythm remains stable, he will hopefully be  ready for discharge home in the next 24-48 hours.  PA and lateral chest  x-ray will be performed on the morning of March 15, 2007, and he will  also be evaluated on morning rounds for decision regarding the timing of  his discharge.   DISCHARGE MEDICATIONS:  1. Amiodarone 400 mg b.i.d. x 1 week, then 200 mg b.i.d.  2. Ultram 50-100 mg q.4h. p.r.n. for pain.  3. Lisinopril/hydrochlorothiazide 2.5/25 one daily.  4. Paroxetine 40 mg daily.  5. Coreg 6.25 mg b.i.d.   DISCHARGE INSTRUCTIONS:  He is asked to refrain from driving, heavy  lifting or strenuous activity.  He may continue ambulating daily and  using his incentive spirometer.  He may shower daily and  clean his  incisions with soap and water.  He will continue the same preoperative  diet.   DISCHARGE FOLLOWUP:  He will see Dr. Edwyna Shell back in the office in 1 week  with a chest x-ray.  He is asked to follow up with Dr. Gala Romney  regarding his heart rhythm.  In the interim, if he experiences any  problems or has questions he is asked to contact our office immediately.      Coral Ceo, P.A.      Ines Bloomer, M.D.  Electronically Signed    GC/MEDQ  D:  03/14/2007  T:  03/15/2007  Job:  [id number]   cc:   Tammy R. Collins Scotland, M.D.  Ardeth Sportsman, MD  Artist Pais Kathrynn Running, M.D.  Bevelyn Buckles. Bensimhon, MD  [facility] Office

## 2010-09-26 NOTE — Assessment & Plan Note (Signed)
OFFICE VISIT   [name]  DOB:  August 08, 1940                                        April 16, 2007  CHART #:  [id number]   SUBJECTIVE:  He came today.  He is still feeling tired.   PHYSICAL EXAMINATION:  Vital signs:  Blood pressure is 106/61, pulse 88,  respirations 18, sat's are 96%.  Lungs:  Clear to auscultation and  percussion.  The incision is well healed.   ASSESSMENT:  He is doing well after his surgery.   PLAN:  I will see him back again in 2 month with a chest x-ray and then  get a CT scan in about 6 months.   [name], M.D.  Electronically Signed   DPB/MEDQ  D:  04/16/2007  T:  04/16/2007  Job:  [id number]

## 2010-09-26 NOTE — Letter (Signed)
June 18, 2007   Anselmo Rod, M.D.  [address]  [address]  [place], [place] [address]   Re:  [name], [name]                 DOB:  [birth year]   Dear Dr. Loreta Ave:   [name] came for followup today.  He is doing well now 3 months since  his surgery.  His incisions are well healed.  His blood pressure is  108/63, pulse 80, respirations 18.  Saturations are 94%.  Lungs are  clear to auscultation and percussion.  We will see him back again in 3  months, and we will get a CT scan at that time.   Ines Bloomer, M.D.  Electronically Signed   DPB/MEDQ  D:  06/18/2007  T:  06/18/2007  Job:  [id number]   cc:   Lajuana Matte, MD

## 2010-09-26 NOTE — Assessment & Plan Note (Signed)
OFFICE VISIT   [name], [name]  DOB:  1940/07/29                                        October 13, 2008  CHART #:  [id number]   The patient came today and followup CT scan 18 months since his surgery  shows no evidence of recurrence and we do not have the official reading,  but we will check that.  His blood pressure was 151/84, pulse 96,  respirations 18, and saturations were 95%.  We will see him back again  in 6 months with a followup CT scan.   Ines Bloomer, M.D.  Electronically Signed   DPB/MEDQ  D:  10/13/2008  T:  10/14/2008  Job:  [id number]

## 2010-09-26 NOTE — Letter (Signed)
March 26, 2007   Tammy R. Collins Scotland, MD  [address] [place]  [address]   Re:  HOSTEEN, KIENAST                 DOB:  Jan 28, 1941   Dear Dr. Collins Scotland:   Mr. Bulger came for follow-up after his surgery.  He had a wedge resection  of left upper lesion with adenocarcinoma with bronchoalveolar features  which go a long with the primary lung cancer.  He is doing well post-  operatively.  His blood pressure is 121/67, pulse 72, respirations 18,  sats are 93%.  Since he is a stage 1 cancer, we will just electively  follow him.  I will see him back in 3 weeks with another chest x-ray.  I  removed his chest tube sutures today.   Ines Bloomer, M.D.  Electronically Signed   DPB/MEDQ  D:  03/26/2007  T:  03/27/2007  Job:  [id number]   cc:   Tammy R. Collins Scotland, M.D.

## 2010-09-26 NOTE — Letter (Signed)
January 30, 2007   [name], M.D.  [address] [place] [address]   Re:  [name], [name]                 DOB:  09-Feb-1941   Dear Dr. Collins [name]:   I appreciate the opportunity to see this 70 year old patient who was  found to have a T2N0M0 distal sigmoid carcinoma that was resected by Dr.  Michaell [name] August 12, 2006.  A CT scan showed a left upper lobe lesion and a  right lower lobe lesion.  A followup CT recently done showed that the  left upper lobe region had increased to approximately 2 cm in size.  The  right lower lobe small 5 mm lesion was unchanged.  She is referred here  for evaluation.  Pulmonary function test shows FVC of 2.9 with an FEV1  of 0.67.  He smoked for many years.  Quit smoking approximately 6 months  ago.  He has had no fevers, chills, excessive sputum.  No hemoptysis or  weight loss.  At the time that he had his colon cancer, he went into  congestive failure.  Was treated by Dr. Gala [name].  He has hypertension  as well as chronic obstructive pulmonary disease.   MEDICATIONS:  1. Lisinopril/hydrochlorothiazide 20/25 one a day.  2. Paroxetine 40 mg a day.  3. Coreg 6.25 one twice a day.   FAMILY HISTORY:  Positive for diabetes, cancer of the lung,  hypertension, and arthritis.   PAST MEDICAL HISTORY:  Also, he has had surgery of knee replacements and  colonoscopy, which were related to his cancer of the colon.   SOCIAL HISTORY:  He quit smoking in January of 2008 and also quit any  alcohol intake at the same time.   REVIEW OF SYSTEMS:  His weight has been stable.  His weight is 195.  He  is 5 feet 11 inches.  CARDIAC:  See history of present illness.  No angina or atrial  fibrillation.  PULMONARY:  See history of present illness.  GI:  See history of present illness.  No GERD.  GU:  No dysuria or frequent urination.  VASCULAR:  No claudication, DVT, or TIA.  NEUROLOGIC:  No headaches, black outs, or seizures.  MUSCULOSKELETAL:  No joint pain  or rash.  No psychiatric illness.  No change in eyesight or hearing.  No problems with bleeding or clotting disorders.   PHYSICAL EXAM:  He is a well-developed Caucasian male in no acute  distress.  His blood pressure was 132/70, pulse 93, respirations 18,  sats 96%.  Head, eyes, ears, nose, and throat are unremarkable.  Neck is  supple without thyromegaly.  There is no supraclavicular or axillary  adenopathy.  Chest is clear to auscultation and percussion.  Heart  regular sinus rhythm.  No murmurs.  Abdomen is soft.  There are surgical  scars.  His pulses are 2+.  There is no clubbing or edema.  Abdomen is  soft with no hepatosplenomegaly.  His neurological exam is intact.   IMPRESSION:  I feel that this patient has an enlarging left upper lobe  lesion that needs to be evaluated.  I have ordered a full set of  pulmonary function tests, an MRI of the brain, a PET scan, and we will  see him back again to discuss further therapy.  I did note he was  treated for congestive heart failure.  His ejection fraction by echo was  60%.  He initially had some creatinine elevation of 2.2, but it came  down to 1.4.   I appreciate the opportunity of seeing this patient.   [name], M.D.  Electronically Signed   DPB/MEDQ  D:  01/30/2007  T:  01/30/2007  Job:  [id number]

## 2010-09-26 NOTE — Discharge Summary (Signed)
NAMETERESA, LEMMERMAN                 ACCOUNT NO.:  [id number]   MEDICAL RECORD NO.:  [id number]          PATIENT TYPE:  INP   LOCATION:  2013                         FACILITY:  [facility]   PHYSICIAN:  Ines Bloomer, M.D. DATE OF BIRTH:  1941/04/20   DATE OF ADMISSION:  03/11/2007  DATE OF DISCHARGE:  03/16/2007                               DISCHARGE SUMMARY   ADDENDUM   Mr. Yono was originally scheduled for discharge home on March 16, 2007.  He was progressing well on that day, however, he was still weak  and was felt that he would require an additional days stay in the  hospital for recuperation.  He otherwise had remained stable.  He is  maintaining normal sinus rhythm on his present medications.  His  pulmonary status has stabilized and he had been weaned from supplemental  oxygen with O2 saturations of greater than 90% on room air.  His labs on  postop day #4 showed sodium 130, potassium 3.5, BUN 20, creatinine 0.93.  He was evaluated by Dr. Edwyna Shell on morning rounds on March 16, 2007,  and at that time was deemed to be ready for discharge home.  Discharge  medications are unchanged from the previously dictated discharge  summary.  Also, discharge instructions and followup are unchanged from  the previously dictated discharge summary.      Coral Ceo, P.A.      Ines Bloomer, M.D.  Electronically Signed    GC/MEDQ  D:  04/23/2007  T:  04/24/2007  Job:  [id number]   cc:   Tammy R. Collins Scotland, M.D.  Ardeth Sportsman, MD  Artist Pais Kathrynn Running, M.D.  Bevelyn Buckles. Bensimhon, MD  [facility] office

## 2010-09-26 NOTE — Assessment & Plan Note (Signed)
OFFICE VISIT   HIRO, VIPOND  DOB:  Mar 30, 1941                                        April 25, 2010  CHART #:  [id number]   The patient's blood pressure was 132/79, pulse 74, respirations 18, sats  were 94%.  I informed him that his CT scan was negative.  No evidence of  recurrence of his cancer.  He has been doing well overall with no  medical problems.  I will see him back again in 6 months with another CT  scan.   Ines Bloomer, M.D.  Electronically Signed   DPB/MEDQ  D:  04/25/2010  T:  04/26/2010  Job:  [id number]

## 2010-09-26 NOTE — Assessment & Plan Note (Signed)
OFFICE VISIT   [name], [name]  DOB:  [birth year]                                        April 27, 2009  CHART #:  [id number]   The patient came today and his CT scan and the preliminary report showed  no evidence of recurrence of his cancer.  His blood pressure is 132/81,  pulse 74, respirations 16, sats were 94%.  His left supraclavicular cyst  was also stable.  I plan to see her back again in 6 months with another  CT scan.   Ines Bloomer, M.D.  Electronically Signed   DPB/MEDQ  D:  04/27/2009  T:  04/28/2009  Job:  [id number]

## 2010-09-26 NOTE — Assessment & Plan Note (Signed)
OFFICE VISIT   [name], [name]  DOB:  August 26, 1940                                        October 26, 2009  CHART #:  [id number]   The patient returns today.  He had gotten real short of breath and went  to the emergency room.  He had a CT scan to rule out pulmonary embolus  and it turned out that he had the right upper lobe pneumonia.  There is  no evidence recurrence of his cancer.  This has since resolved and is  being followed up by Dr. Herb Grays.  His blood pressure was 131/79,  pulse 89, respirations 18, and sats were 97%.  I will see him back again  in 6 months with another CT scan for cancer followup.   Ines Bloomer, M.D.  Electronically Signed   DPB/MEDQ  D:  10/26/2009  T:  10/27/2009  Job:  [id number]

## 2010-09-26 NOTE — H&P (Signed)
NAME:  [name], [name]                 ACCOUNT NO.:  [id number]   MEDICAL RECORD NO.:  [id number]          PATIENT TYPE:  INP   LOCATION:  NA                           FACILITY:  [facility]   PHYSICIAN:  Ines Bloomer, M.D. DATE OF BIRTH:  10-27-1940   DATE OF ADMISSION:  DATE OF DISCHARGE:                              HISTORY & PHYSICAL   CHIEF COMPLAINT:  Left upper lobe mass.   The patient was found on August 12, 2006 by Dr. Michaell Cowing resected a end-to-  end distal sigmoid cancer.  A biopsy scan showed a left upper lobe  lesion and right lower lobe lesion.  The CT scan after that showed that  the left upper lobe lesion increased in size 2 cm.  The right lower lobe  lesion was still 5 mm and unchanged.  Pulmonary function tests showed a  FVC of 2.9, and FEV1 of 0.67.  He smoked for many years.  Quit smoking  approximately 6 months ago.  He has had no fever, chills, or excessive  sputum.  No weight loss or hemoptysis.  When he had his colon cancer, he  had a mild episode of congestive failure which was treated by Dr.  Gala Romney.  He also has hypertension and chronic obstructive pulmonary  disease.  A repeat pulmonary function test was done which showed  improvement with an FVC of 3.36 and a FEV1 of 1.98.  Brain scan was  negative except for multiple small cavernous malformations.  His PET  scan was positive for the left upper lobe lesion, negative for the right  lower lobe lesion.   MEDICATIONS:  1. Include lisinopril.  2. Hydrochlorothiazide 20/25 one a day.  3. Paroxetine 40 mg a day.  4. Coreg 6.51 twice a day.   FAMILY HISTORY:  Positive for diabetes, lung cancer, hypertension, and  arthritis.   PAST MEDICAL HISTORY:  1. He has had ileostomy.  2. Two knee replacements.   SOCIAL HISTORY:  He quit smoking in January of 2008.  Does not drink  alcohol.  He is married.   REVIEW OF SYSTEMS:  VITAL SIGNS:  He is 195 pounds, 5 foot 11 inches.  CARDIAC:  See history or present illness.   No history of angina or  atrial fibrillation.  PULMONARY:  See  history of present illness.  GI:  See history of present illness.  No GERD.  GU:  No history of frequent  urination or kidney disease.  VASCULAR:  No claudication, DVT, TIA.  NEUROLOGICAL:  No headache, black outs, or seizures.  MUSCULOSKELETAL:  No joint swelling or rash.  PSYCHIATRIC:  No nervousness or depression.  ENT:  No change in his eyesight or hearing.  HEMOLOGICAL:  No problems  with  clotting disorders, bleeding, or anemia.   PHYSICAL EXAMINATION:  GENERAL:  Well-developed, well-nourished  Caucasian male in no acute distress.  VITAL SIGNS:  Blood pressure is 133/70, pulse 93, respirations 18,  saturations 96%.  HEENT:  Head is atraumatic.  Eyes, pupils equally round and reactive to  light and accommodation.  Extraocular movements are normal.  Ears,  tympanic membranes are intact.  Nose, there is no septal deviation.  Throat is without lesions.  NECK:  Supple without thyromegaly.  There is no jugular venous  distention.  No carotid bruits.  HEART:  Sinus rhythm, no murmurs.  CHEST:  Clear to auscultation and percussion.  ABDOMEN:  Soft.  There are surgical scars.  EXTREMITIES:  Pulses are 2+.  There is no clubbing or edema.  NEUROLOGIC EXAM:  He is oriented x3.  Sensory and motor are intact.  Cranial nerves are intact.   IMPRESSION:  1. Enlarging left upper lobe lesion with a positive PET.  2. History of colon cancer.  3. Hypertension.  4. History of congestive failure.  5. Chronic obstructive pulmonary disease.   PLAN:  Left VATS with wedge resection and seed implantation.      Ines Bloomer, M.D.  Electronically Signed     DPB/MEDQ  D:  02/25/2007  T:  02/26/2007  Job:  [id number]

## 2010-09-26 NOTE — H&P (Signed)
NAME:  [name], [name]                 ACCOUNT NO.:  [id number]   MEDICAL RECORD NO.:  [id number]          PATIENT TYPE:  INP   LOCATION:  NA                           FACILITY:  [facility]   PHYSICIAN:  Ines Bloomer, M.D. DATE OF BIRTH:  03-10-1941   DATE OF ADMISSION:  DATE OF DISCHARGE:                              HISTORY & PHYSICAL   CHIEF COMPLAINT:  Left upper lobe mass.   HISTORY OF PRESENT ILLNESS:  History and physical was dictated on  February 26, 2007.  Surgery had to be canceled because of an upper  respiratory infection.  He is being readmitted today for surgery.  This  patient has had a previous resection of a colon cancer and then  developed a left upper lobe and a right lower lobe lesion.  The left  upper lobe lesion increased in size to 2 cm.  The right lower lobe  lesion was negative on followup.  He had also been treated for  congestive heart failure by Dr. Gala Romney and he has hypertension,  chronic obstructive pulmonary disease.  Pulmonary function tests showed  an FVC of 3.36 with an FEV-1 of 1.98.  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  His PET scan was positive for the left upper  lobe lesions.   MEDICATIONS:  Include lisinopril, hydrochlorothiazide 20/25 one a day,  paroxetine 40 mg a day and Coreg 6.25 twice a day.   FAMILY/SOCIAL HISTORY:  Refer to the previous history for his family  history and social history.  He did quit smoking in January 2008.   REVIEW OF SYSTEMS:  Recorded in his previous history.   PHYSICAL EXAMINATION:  VITAL SIGNS:  His blood pressure was 120/75,  pulse 85, respirations 20, saturations were 94%.  HEAD/EYES/EARS/NOSE/THROAT:  Unremarkable.  NECK:  Supple without thyromegaly.  There is no jugular venous  distention.  HEART:  Regular sinus rhythm, no murmurs.  CHEST:  Clear to auscultation and percussion.  ABDOMEN:  He has surgical scars and an ileostomy.  EXTREMITIES:  Pulses are 2+.  There is no clubbing or  edema.  NEUROLOGIC:  Intact.   IMPRESSION:  1. Enlarging left upper lobe lesion with positive positron emission      tomography.  2. History of colon cancer status post resection of the sigmoid.  3. Hypertension.  4. History of congestive heart failure.  5. Chronic obstructive pulmonary disease.  6. History of tobacco abuse.   PLAN:  Left VATS, wedge resection and seed implantation.      Ines Bloomer, M.D.  Electronically Signed     DPB/MEDQ  D:  03/08/2007  T:  03/09/2007  Job:  [id number]

## 2010-09-26 NOTE — Letter (Signed)
Sep 17, 2007   Matthew A. Kathrynn Running, M.D.  [address] [facility]  [place], [place] [address]   Re:  Jonathan, [name]                 DOB:  [birth year]   Dear [name],   I saw [name] back in the office today and he is doing well.  His CT  scan showed no evidence of recurrence.  The small subpleural nodule on  the left has remained unchanged.  I plan to see him back again in 6  months with another CT scan.   PHYSICAL EXAMINATION:  LUNGS:  Clear to auscultation and percussion.  HEART:  Regular sinus rhythm.   Overall, he is doing well.   Ines Bloomer, M.D.  Electronically Signed   DPB/MEDQ  D:  09/17/2007  T:  09/17/2007  Job:  [id number]

## 2010-09-29 NOTE — Letter (Signed)
July 10, 2006    Tammy R. Collins Scotland, M.D.  [address] [place] [address]   RE:  DWAN, HEMMELGARN  MRN:  [id number]  /  DOB:  06-Nov-1940   Dear Dr. Collins Scotland:   Upon your kind referral, I had the pleasure of evaluating your patient  and I am pleased to offer my findings.  I saw [name] in the office  today.  Enclosed is a copy of my progress note that details my findings  and recommendations.   Thank you for the opportunity to participate in your patient's care.    Sincerely,      [name], MD,FACG  Electronically Signed    RDK/MedQ  DD: 07/10/2006  DT: 07/10/2006  Job #: [id number]

## 2010-09-29 NOTE — Discharge Summary (Signed)
[place]  Patient:    [name], [name] Visit Number: [id number] MRN: [id number]          Service Type: SUR Location: 5000 [id number] Attending Physician:  Carolan Shiver Ii Dictated by:   Jamelle Rushing, P.A. Admit Date:  10/20/2001 Discharge Date: 10/23/2001                             Discharge Summary  ADMISSION DIAGNOSES: 1. End-stage osteoarthritis, bilateral hips left greater than right. 2. Tobacco abuse.  DISCHARGE DIAGNOSES: 1. Left total hip arthroplasty. 2. Asymptomatic postoperative blood loss anemia. 3. History of chronic obstructive pulmonary disease. 4. History of tobacco use. 5. History of hypertension.  PROCEDURE:  On October 20, 2001, the patient was taken to the OR by Jonny Ruiz L. Rendall III, M.D. and assisted by Jerolyn Shin. Tresa Res, M.D.  Under general anesthesia the patient underwent a left AML total hip replacement, a 16.5 AML total hip stem with a +1 hip ball was placed.  Pinnacle Marathon insert +4 with a 10 degree angulated lip for a 32 mm head, 300 series Pinnacle cup at 56 mm acetabular component was placed.  Estimated blood loss was 250 cc and there were no complications.  The patient tolerated the procedure well and was transferred to the recovery room and to the orthopedic floor in good condition.  CONSULTING PHYSICIANS:  The following routine consults were requested; physical therapy, occupational therapy, rehabilitation, and case management.  HOSPITAL COURSE:  On October 20, 2001, the patient was admitted to [facility] under the care of John L. Dorothyann Gibbs, M.D.  The patient was taken to the OR where a left total hip arthroplasty was performed.  The patient tolerated this procedure well and was transferred to the recovery room and then to the orthopedic floor for routine postoperative care.  The patient was placed on Arixtra 2.5 mg subcu q.d. for routine DVT prophylaxis.  The patient then incurred a  total of three days postoperative care in which the patient remained afebrile, his vital signs remained stable.  He worked very well with physical therapy.  He did develop some slight postoperative blood loss anemia, but was pretty much asymptomatic, not requiring any blood transfusions.  The patients wound remained benign.  His leg remained neuromotovascularly intact.  He did request a Nicoderm patch to prevent reuse of tobacco during hospitalization.  The patient progressed very nicely with physical therapy and was felt stable and ready to be discharged to home on postoperative day #3, so arrangements were made with home health physical therapy and the patient was discharged home in good condition.  LABORATORY DATA:  Venous Doppler on June 10 of bilateral lower extremities was negative for any signs of DVT, SVT, or Bakers cyst.  EKG on admission was normal sinus rhythm, nonspecific T wave abnormality to 91 beats per minute, PRT axes are 39, 19, and 60.  Chest x-ray on admission was negative for any active disease.  CBC on June 12; WBC 6.9, hemoglobin 10.3, hematocrit 29.4, and platelets 198. Routine chemistries on June 11; sodium 135, potassium 3.8, glucose 121 down from 130 the day previous, BUN 14, creatinine 1.0.  Routine urinalysis on admission was normal.  DISCHARGE MEDICATIONS:  1. Colace 100 mg p.o. b.i.d.  2. Trinsicon one tablet p.o. t.i.d.  3. Arixtra 2.5 mg subcu q.24h.  4. Avapro 300 mg p.o. q.d.  5. Hydrochlorothiazide 12.5  mg p.o. q.d.  6. Vioxx 25 mg p.o. q.d.  7. Nicoderm patch 21 mg q.24h. one patch q.d.  8. Oxycodone SR 10 mg p.o. q.12h.  9. Laxative or enema of choice p.r.n. 10. Reglan 10 mg p.o. q.8h. p.r.n. 11. Percocet one or two tablets every four to six hours p.r.n. breakthrough     pain. 12. Tylenol 650 mg p.o. q.4h.  DISCHARGE INSTRUCTIONS:  The patient is to continue Arixtra for a total of seven days.  Oxycontin SR 10 mg p.o. q.12h., Oxy IR 5 mg one  or two tablets every four to six hours.  Aspirin 325 mg a day after completing Arixtra. Activity as tolerated with the use of crutches.  Diet no restrictions.  The patient is to keep wound clean and dry.  FOLLOW-UP:  The patient is to have a follow-up appointment with Dr. Priscille Kluver the following Tuesday.  CONDITION ON DISCHARGE:  Good. Dictated by:   Jamelle Rushing, P.A. Attending Physician:  Carolan Shiver Ii DD:  11/15/01 TD:  11/18/01 Job: [id number]

## 2010-09-29 NOTE — Op Note (Signed)
[place]  Patient:    [name], [name] Visit Number: [id number] MRN: [id number]          Service Type: SUR Location: 5000 5032 01 Attending Physician:  Carolan Shiver Ii Dictated by:   Carlisle Beers. Dorothyann Gibbs, M.D. Proc. Date: 10/20/01 Admit Date:  10/20/2001                             Operative Report  PREOPERATIVE DIAGNOSIS:  End-stage osteoarthritis, left hip.  POSTOPERATIVE DIAGNOSIS:  End-stage osteoarthritis, left hip.  PROCEDURE:  Left AML total hip replacement.  SURGEON:  John L. Dorothyann Gibbs, M.D.  ASSISTANTJerolyn Shin. Tresa Res, M.D.  ANESTHESIA:  General.  INDICATION AND JUSTIFICATION FOR PROCEDURE:  Chronic arthritis, both hips, with pain, resistant to conservative measures.  PATHOLOGY:  The patient has bone against bone, left hip, with a completely deformed femoral head, cone-shaped, with no remaining hyaline cartilage.  DESCRIPTION OF PROCEDURE:  Under general anesthesia, the patient was placed in the right lateral decubitus position.  The left hip was prepared with Duraprep and draped as a sterile field.  A posterior approach is made, splitting the IT band in the line of its fibers and inserting a Charnley retractor.  Multiple small vessels were cauterized.  The short external rotators and hip capsule are taken down from the femur with electrocautery.  The hip capsule is opened in a T-shaped manner.  The hip was then dislocated.  The superior femoral neck is exposed, an IM initiator and canal finder are used.  The femoral canal is progressively reamed to 16 mm.  The femoral neck is then osteotomized using a guide.  The femoral canal is then rasped with a 12 mm, 13.5, 15, and 16.5 rasp, standard.  Calcar reamer is used for excellent fit of the neck.  With the femoral side complete, attention is turned to the acetabulum.  It is exposed with cobras inferiorly and a hip superiorly.  Two wing retractors are inserted after  elevating the hip capsule and preserving it.  The sciatic nerve is seen and protected out of the way.  The labrum is excised and the ligament of Teres.  The acetabulum is then progressively reamed up to 55 mm.  A 54 bottoms out nicely, and a 56 has excellent rim fit.  A permanent component 300 series Pinnacle cup is then inserted and then trial fittings of high-offset +4 acetabular liner and the 32 mm with the high-offset femur reveals excellent fit, alignment, and stability.  Permanent components are then obtained, an apex hole eliminator is used.  The acetabular cup, Pinnacle Marathon, is inserted, +4, with a 10 degree angulated liner for a 32 mm head.  The large-stature, high-offset AML 16.5 total hip stem was then inserted with a +1 Articules hip ball.  This gives excellent fit, alignment, and stability. There is some tightness in extension requiring an anterior capsular release from the femur with an elevator, and this improves this very nicely.  The patient has had a chronic hip flexion contracture, and this seems to improve it.  Following this the wound is closed in layers, closing first the hip capsule, repairing the short external rotators, and closing the IT band with #1 Tycron, the subcu with #1 Vicryl, and the skin with clips.  Blood loss estimated 250 cc.  No complications.  The patient returned to recovery in good condition. Dictated by:   Carlisle Beers.  Dorothyann Gibbs, M.D. Attending Physician:  Carolan Shiver Ii DD:  10/20/01 TD:  10/22/01 Job: [id number]

## 2010-09-29 NOTE — Assessment & Plan Note (Signed)
[facility]                            CARDIOLOGY OFFICE NOTE   [name], [name]                        MRN:          [id number]  DATE:09/11/2006                            DOB:          May 28, 1940    PRIMARY CARE PHYSICIAN:  Tammy R. Collins Scotland, M.D.   INTERVAL HISTORY:  [name] is a very pleasant, 70 year old male with no  known coronary artery disease, although he does have a history of COPD,  hypertension.  He was recently found to have a colonic mass and  underwent resection.  Postoperatively, he had atrial fibrillation with  rapid ventricular response.  He ruled out for myocardial infarction.  He  was treated with beta blockers and diltiazem and quickly reverted  spontaneously to a normal rhythm.  He did not have any recurrent  arrhythmias.   He returns today.  He is feeling much better.  He denies any  palpitations, no chest pain, no shortness of breath.  He says he feels  much better since he stopped smoking in January.   PROBLEM LIST:  1. Postoperative atrial fibrillation with normal ejection fraction.  2. Hypertension.  3. Chronic obstructive pulmonary disease, quit smoking in January      2008.  4. Osteoarthritis status post bilateral knee and hip replacements.  5. History of colon cancer status post resection.   CURRENT MEDICATIONS:  1. Lisinopril/hydrochlorothiazide 20/25.  2. Cardia 240 mg a day.  3. Aspirin 325 a day.  4. Metoprolol 50 a day.   PHYSICAL EXAMINATION:  GENERAL:  He is well-appearing, in no acute  distress, ambulates around the clinic without any respiratory  difficulty.  VITAL SIGNS:  Blood pressure is 134/68.  His heart rate is 62.  His  weight is 187.  HEENT:  Normal.  NECK:  Supple.  No JVD.  Carotids are 2+ bilaterally, no bruits.  There  is no lymphadenopathy or thyromegaly.  CARDIAC:  He has a regular rate and rhythm.  No murmurs, rubs or  gallops.  LUNGS:  Clear with decreased air movement.  ABDOMEN:   Soft, nontender, nondistended.  There are well-healed surgical  scars.  Good bowel sounds.  EXTREMITIES:  Normal with no cyanosis, clubbing, or edema.  No rash.  NEUROLOGIC:  Alert and oriented x3.  Cranial nerves II-XII intact.  He  moves all four extremities without difficulty.  Affect is normal.   ASSESSMENT/PLAN:  1. Hypertension.  This is suboptimally controlled.  We will switch his      metoprolol over to Coreg 6.25 b.i.d.  2. Postoperative atrial fibrillation.  This has resolved.  We can      consider placing a Holter monitor on him in the future to evaluate      for any paroxysmal atrial fibrillation, but I suspect this will be      low yield.  We will continue aspirin.  3. Cardiovascular risk screening.  He is at high risk for underlying      coronary artery disease given his comorbidities.  We will plan to      schedule him for  adenosine Myoview for risk stratification over the      next few weeks.  4. Erectile dysfunction.  I gave him the go ahead to go ahead and      resume using Cialis.  I told him if he had any chest pain, that he      needed to contact me immediately and he understands that he is      never to use nitrates in this setting.     [name]. Bensimhon, MD     DRB/MedQ  DD: 09/11/2006  DT: 09/12/2006  Job #: [id number]   cc:   Tammy R. Collins Scotland, M.D.

## 2010-09-29 NOTE — Discharge Summary (Signed)
NAME:  [name], [name]                 ACCOUNT NO.:  [id number]   MEDICAL RECORD NO.:  [id number]          PATIENT TYPE:  INP   LOCATION:  3732                         FACILITY:  [facility]   PHYSICIAN:  Ardeth Sportsman, MD     DATE OF BIRTH:  August 03, 1940   DATE OF ADMISSION:  08/12/2006  DATE OF DISCHARGE:                               DISCHARGE SUMMARY   PRIMARY CARE PHYSICIAN:  Tammy R. Collins Scotland, M.D.   SURGEON:  Ardeth Sportsman, MD.   CARDIOLOGIST:  [facility], [employer], M.D.   GASTROENTEROLOGIST:  [name], MD, Clementeen Graham.   DIAGNOSES:  1. 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  2. Atrial fibrillation with rapid ventricular response, converted.  3. Chronic obstructive pulmonary disease with:  4. Tobacco abuse, 60 pack-year history, quit January 2008.  5. Hypertension.  6. 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  7. Hypokalemia, improving with replacement.  8. Osteoarthritis; status post bilateral hip and knee replacements.  9. Gastroesophageal reflux disease, mild, usually controlled with over-      the-counter medication.   HOSPITAL COURSE:  [name] is a 70 year old male who had a large polyp  in his distal sigmoid / proximal rectum with pathology suspicious.  Recommendation was made for surgical resection; and he underwent a  laparoscopic resection on August 12, 2006.  Postoperatively he developed  a rapid ventricular heart rate and atrial fibrillation on postoperative  day #2.  Fortunately he was hemodynamically stable.  A cardiology  consultation was made and he was converted ultimately with IV metoprolol  and intermittent IV Cardizem within about 24 hours.  He was switched  over to oral Cardizem and metoprolol per cardiology's recommendations.   He did have some hypokalemia that was replaced.  He did bump his  creatinine up to around 2.2, but he was never oliguric, and ultimately  at  the time of discharge he was improved to 1.4.  He had some mild  hypokalemia that seemed to improve.  He did have some cholesterol  studies which were normal, aside from a total cholesterol of 137, and  triglycerides around 119.  He had an echocardiogram which showed a left  ventricular ejection fraction around 60%, and mild mitral valve  thickening, but no significant dyskinesia.   He developed flatus around postop day #2 and actually was tolerating  p.o. pretty well around this time.  He had some mild nausea around  postop day #5; but, by the time of discharge, he had had bowel  movements, was having flatus, his abdomen was nice and relaxed, with no  nausea, and he was tolerating a solid diet.   DISPOSITION:  Based on an improvement, we thought it would be reasonable  for him to be discharged home with the following instructions:   1. He is to follow up with Dr. Karie Soda at [facility] in about two weeks for a surgical evaluation.  Next he is      to follow  up with Dr. [facility] on September 11, 2006.  2. He should continue his home medications which include lisinopril /      hydrochlorothiazide 20/25 daily; multivitamin daily; Pepcid as      needed.  3. He also should start his new medications, enteric coated aspirin      325 mg daily; Cardizem CD 250 mg daily; metoprolol extended release      50 mg daily.  4. He should take Vicodin 5/500 1-2 p.o. q.4h. as needed for pain; he      can take some low dose ibuprofen as needed for pain as well; and      Tylenol as needed for pain as well.  5. Ice packs and heating pads were also recommended for pain control.  6. He should be able to go home on room air and not need any oxygen.      However, if his saturations are less than 88%, he may need some      home oxygen in the meantime.  Perhaps needs a pulmonary evaluation      to make sure he does not have any clinically significant chronic       disease.  7. He should call if he has any fevers, chills, sweats, nausea,      vomiting, wound drainage or any other concerns.      Ardeth Sportsman, MD  Electronically Signed     SCG/MEDQ  D:  08/18/2006  T:  08/18/2006  Job:  [id number]   cc:   Tammy R. Collins Scotland, M.D.  [employer], M.D.  [name], MD,FACG

## 2010-09-29 NOTE — H&P (Signed)
[place]  Patient:    [name], [name] Visit Number: [id number] MRN: [id number]          Service Type: Attending:  Carlisle Beers. Dorothyann Gibbs, M.D. Dictated by:   Jamelle Rushing, P.A. Adm. Date:  10/20/01                           History and Physical  DATE OF BIRTH:  July 19, 1940.  CHIEF COMPLAINT:  Bilateral hip pain.  HISTORY OF PRESENT ILLNESS:  The patient is a 70 year old white male with a history of bilateral hip pain in the last eight to 12 months.  The patient indicates it has progressively worsened with any time.  He does have problems ambulating, changing position from the sitting to standing position, going up and down stairs and in and out of the car.  The patient describes the pain as a deep, sharp pain which is located along the lateral aspect of the thighs from th buttocks region down to the knees, occasionally with some discomfort up into the lower back.  The pain worsens with any type of weightbearing activity.  It does cause him difficulty with sleep at night.  He is unable to put his own socks on at the present time.  He denies any specific injury.  He does have popping and grinding in the hip with occasional sensation like the hip will give out.  The patient is currently ambulating without the use of a cane.  ALLERGIES:  No known drug allergies.  MEDICATIONS: 1. Diovan 160/12.5 mg one tablet q.d. 2. Celebrex 200 mg p.o. b.i.d. 3. Tylenol No. 4 p.r.n.  PAST MEDICAL HISTORY:  The patient has been fairly well-controlled for hypertension on the current medications.  The patient does have a significant smoking medical history.  Otherwise the patient denies any diabetes, thyroid disease, hiatal hernia, peptic ulcer, heart disease, or respiratory disease, but the patient does have a 45 pack-year smoking history.  PAST SURGICAL HISTORY: 1. The patient has bilateral total knee arthroplasties with one revision of    the right  knee. 2. Bilateral cataract implants.  The patient denies any complications of any of the above-mentioned surgical procedures.  SOCIAL HISTORY:  The patient is a 70 year old white male with approximately a 45 pack-year smoking history.  He does drink one to two beers or alcoholic beverages a day.  He is currently married.  He is currently retired as a Animator.  FAMILY HISTORY:  Mother is deceased of age-related issues at 54 years of age. Father is deceased from complications of a hip fracture after a fall.  The patient has one brother deceased from a gunshot wound, one brother deceased from a CVA with a history of hypertension, one sister deceased from lung cancer, and two sisters deceased from complications of diabetes.  The patient has one sister alive with a history of hypertension.  REVIEW OF SYSTEMS:  Positive for upper and lower dentures.  He does have bilateral corneal implants.  He does have a problem with occasional increased heartburn and indigestion, for which he uses Tums, no other medical treatment. He has no other medical issues as indicated through the review of systems.  PHYSICAL EXAMINATION:  VITAL SIGNS:  Height is 6 feet even, weight is 188 pounds.  Pulse of 92 and regular with occasional skipped beats, respirations 12, temperature is 99.0, blood pressure 140/68.  GENERAL:  This is a healthy-appearing  well-developed white male with no significant obesity.  He is able to ambulate with a left-sided limp.  He does not use a cane or assistive device.  He is able to get on and off the exam table without much difficulty.  HEENT:  Head was normocephalic, atraumatic.  Nontender over maxillary and frontal sinuses.  Pupils are equal, round, and reactive, accommodating to light.  Extraocular movements intact.  Sclerae are anicteric.  Conjunctivae are pink and moist.  External ears without deformities, canals patent, TMs are pearly-gray and intact.  Gross  hearing is intact.  Nasal septum is midline, mucous membranes pink and moist, no polyps noted.  Oral buccal mucosa was pink and moist without lesions.  Dentition was in good repair.  NECK:  Supple, no palpable lymphadenopathy.  Thyroid gland was nontender.  The patient had good range of motion of the cervical spine without any difficulty or tenderness.  CHEST:  Lung sounds were distant.  The patient had a fair amount of inspiratory effort with some diffuse end-expiratory wheezes.  CARDIAC:  Regular rate and rhythm, S1 and S2 auscultated, with an occasional skipped beat.  ABDOMEN:  Round, soft, nontender, bowel sounds were normally active throughout.  No hepatosplenomegaly.  CVA was nontender to percussion.  EXTREMITIES:  Upper extremities were symmetrically sized and shaped with excellent range of motion of the shoulders, elbows, and wrists, without difficulty, 5/5 motor strength in all muscle groups tested.  Lower extremities:  Left hip had symmetrical loss of range of motion and was blocked out by mechanical blocking and discomfort.  He had 10 degrees of internal rotation and 0 degrees external rotation bilaterally.  Bilateral knees were symmetrically sized and shaped with midline surgical incision.  He had no discomfort with medial or lateral joint line palpation, with no signs of medial-lateral or anterior-posterior instability.  Negative anterior and posterior drawer.  Bilateral calves were nontender.  Ankles were symmetrically sized and shaped with good dorsiflexion and plantar flexion.  PERIPHERAL VASCULATURE:  Carotid pulses 2+.  Radial pulses 2+.  Unable to palpate femoral pulses.  Dorsalis pedis and posterior tibial pulses were 1+. No lower extremity edema or venous stasis changes noted.  NEUROLOGIC:  The patient was conscious, alert, and appropriate, held an easy conversation with the examiner.  Cranial nerves II-XII were grossly intact. Deep tendon reflexes of the  upper and lower extremities were symmetrical.  BREASTS, RECTAL, GENITOURINARY:  Deferred at this time.   IMPRESSION: 1. End-stage osteoarthritis, bilateral hips, left greater than right. 2. History of tobacco use.  PLAN:  The patient will be admitted to [facility] on October 17, 2001, under the care of John L. Dorothyann Gibbs, M.D.  The patient will undergo all routine labs and tests prior to having a left total hip arthroplasty.  The patient has been evaluated by a primary care physician and has been deemed medically appropriate for this total hip arthroplasty. Dictated by:   Jamelle Rushing, P.A. Attending:  Carlisle Beers. Dorothyann Gibbs, M.D. DD:  10/14/01 TD:  10/15/01 Job: [id number]

## 2010-09-29 NOTE — Consult Note (Signed)
[name], PLANTZ                 ACCOUNT NO.:  [id number]   MEDICAL RECORD NO.:  [id number]          PATIENT TYPE:  INP   LOCATION:  5741                         FACILITY:  [facility]   PHYSICIAN:  Bevelyn Buckles. Bensimhon, MDDATE OF BIRTH:  1940-09-09   DATE OF CONSULTATION:  08/14/2006  DATE OF DISCHARGE:                                 CONSULTATION   REFERRING PHYSICIAN:  Ardeth Sportsman, M.D., of [facility].   PRIMARY CARE PHYSICIAN:  Tammy R. Collins Scotland, M.D.   CARDIOLOGIST:  He is new to [facility].   REASON FOR CONSULTATION:  Postop atrial fibrillation with rapid  ventricular response.   HISTORY OF PRESENT ILLNESS:  [name] is a 70 year old male with no  known cardiac history.  He does have a history of COPD and hypertension.  He recently quit smoking on January 1st.  He tells me that he had a  stress test 5 or 10 years ago, was part of a routine evaluation, which  was okay.  He was admitted with rectal bleeding and found to have a  colon mass which was resected on March 31st.  He has been recovering on  the floor.  He has been on a morphine PCA pump.  While on the pulse  oximeter, it was noted that he was quite tachycardic with heart rates in  the 170s and 180s.  He was asymptomatic with this.  An EKG was obtained  which showed atrial fibrillation with a rapid ventricular response.  No  significant ST-T wave changes.  He continues to feel fine.  He has been  given some IV Lopressor.  Heart rate is now in the 140-150 range with a  systolic blood pressure in the 90s.   REVIEW OF SYSTEMS:  He does have some heartburn but denies any chest  pain.  He has gained 21 pounds after quitting smoking.  He does have  some chronic dyspnea on exertion and cough.  As per HPI, he did have  some  bright red blood per rectum, also has some abdominal pain and  gastroesophageal reflux disease.  Rest of review of systems is negative  except for HPI and problem list.   PROBLEM  LIST:  1. COPD, quit smoking January 2008.  2. Hypertension.  3. Osteoarthritis status post bilateral knee and hip replacements.  4. Colon mass status post resection this admission.   CURRENT MEDICATIONS:  1. Heparin 5000 units subcu q.8.  2. Protonix 40 a day.  3. Morphine PCA.  4. Lisinopril 20 a day.  5. Hydrochlorothiazide 25 a day.  6. Multivitamin.  7. Lopressor 25 b.i.d.  8. Lactated Ringers 75 an hour.   ALLERGIES:  None.   SOCIAL HISTORY:  He lives in [place] in [place] with his wife.  Retired.  Previously worked in a Therapist, music as a Merchandiser, retail.  History of  tobacco, 1 pack per day x45 years, quit in January.  Alcohol, has 2-3  drinks per night.   FAMILY HISTORY:  Father died at 50 due to old age.  Mother died due to  complications  from a hip fracture.  Two brothers, one died from a  stroke, other died from a gunshot wound.  One sister died from lung  cancer and 2 sisters died from complications of diabetes.  There is no  family history of premature coronary artery disease.   PHYSICAL EXAMINATION:  He is in no acute distress.  He is lying in bed  without any respiratory difficulty.  Blood pressure now is about 98/70.  His temperature is 99.5, heart rate is 160.  Satting 94% on 2 liters.  HEENT:  Sclerae anicteric.  EOMI.  No xanthelasma.  His mucous membranes  are moist.  Oropharynx is clear.  NECK:  Is supple.  There is no obvious JVD.  Carotids are 2+ bilaterally  without any obvious bruits.  There is no lymphadenopathy, thyromegaly.  CARDIAC:  He has distant heart sounds, he is tachycardic and regular.  No obvious murmurs.  LUNGS:  Are clear with diminished air movement at the bases.  ABDOMEN:  Is mildly distended.  There are multiple surgical scars.  He  is mildly tender.  Hypoactive bowel sounds.  EXTREMITIES:  Are warm with no cyanosis, clubbing, or edema.  There is  no rash.  Distal pulses are 1+ bilaterally.  NEURO:  He is alert and oriented x3.   Cranial nerves II-XII are intact.  Moves all 4 extremities without difficulty.  Affect is very pleasant.   An EKG shows atrial fibrillation with rapid ventricular response.  Chest  x-ray shows cardiomegaly with some mild pulmonary venous prominence.  There is also some bibasilar atelectasis.   LABS:  Show a BNP of 370, hemoglobin is 12, white count is 9.7,  platelets are 243,000, creatinine is 2.1, preoperatively was 0.9 and  peaked at 2.2.   ASSESSMENT:  1. Postoperative atrial fibrillation with rapid ventricular response.  2. Mildly elevated brain natriuretic peptide.  3. Colon mass status post resection.  4. Chronic obstructive pulmonary disease.  5. Hypertension.  6. Acute renal insufficiency presumed secondary to acute tubular      necrosis in the postoperative setting.   PLAN/DISCUSSION:  Will transfer him to tele.  We will attempt to slow  him down with diltiazem drip.  If we are unable to slow him down, he may  need cardioversion.  Given his recent surgery, he is not a great  candidate for  full anticoagulation.  Will continue his subcu heparin  and start him on aspirin, 325.  Once his heart rates slow, I will check  an echocardiogram.  Will also check a TSH.  I suspect his cardiac  enzymes may bump a bit given his degree of tachycardia and we will  follow this.  I would have a very high threshold to perform  catheterization given his recent surgery.   Regarding his renal insufficiency, I suspect this is related to  postoperative acute tubular necrosis.  I would not be overly aggressive  with his IV fluids as his volume status appears to be a little bit up.  We will hold his ACE inhibitor and diuretic.   We appreciate the consult.  We will continue to follow with you.      Bevelyn Buckles. Bensimhon, MD  Electronically Signed     DRB/MEDQ  D:  08/14/2006  T:  08/14/2006  Job:  [id number]

## 2010-09-29 NOTE — Discharge Summary (Signed)
NAME:  [name], [name]                           ACCOUNT NO.:  [id number]   MEDICAL RECORD NO.:  [id number]                   PATIENT TYPE:  INP   LOCATION:  5018                                 FACILITY:  [facility]   PHYSICIAN:  John L. Rendall, M.D.               DATE OF BIRTH:  1941/01/17   DATE OF ADMISSION:  02/18/2002  DATE OF DISCHARGE:  02/21/2002                                 DISCHARGE SUMMARY   DISCHARGE DIAGNOSES:  1. End-stage osteoarthritis, right hip.  2. Tobacco use.  3. Alcohol use.  4. Hypertension.   DISCHARGE DIAGNOSES:  1. Right total hip arthroplasty.  2. Hyponatremia.  3. Tobacco use.  4. Alcohol use.  5. Hypertension.   HISTORY OF PRESENT ILLNESS:  The patient is a 70 year old white male with a  history of bilateral hip pain for the last one to two years.  The patient  had a left total hip arthroplasty in June 2003 with good results.  The  patient is still having right hip pain with ambulation and sitting.  He  describes the pain as a sharp pain through the thigh and around to the  buttocks region.  He does have difficulty with sleep.  He does have multiple  mechanical symptoms.   ALLERGIES:  No known drug allergies.   CURRENT MEDICATIONS:  1. Diovan 160/12.5 mg p.o. q.d.  2. Aleve p.r.n.   SURGICAL PROCEDURE:  The patient was taken to the OR by John L. Rendall,  M.D., assisted by Legrand Pitts. Duffy, P.A.-C.  Under general anesthesia, the  patient underwent a right hip replacement without any complications.  Estimated blood loss was 450 cc.  The patient tolerated the procedure well.  There were no drains left in placed.  The patient was transferred to the  recovery room and then to the orthopedic floor in good condition.   CONSULTATIONS:  The following routine consults were requested:  Physical  therapy, occupational therapy, case management.   HOSPITAL COURSE:  On 02/18/2002, the patient was admitted to [facility] by Jonny Ruiz L. Rendall, M.D.   The patient was taken to the OR where a  right total hip arthroplasty was performed.  The patient tolerated the  procedure well. Estimated blood loss was about 450 cc.  The patient was  transferred to the recovery room and then to the orthopedic floor in good  condition.  The patient was started on Arixtra for DVT prophylaxis.   The patient then incurred a total of three days postoperative care on the  orthopedic floor during which the patient worked well with some physical  therapy.  His vital signs remained stable.  He remained afebrile.  His wound  remained benign for any signs of infection.  His leg remained intact for  neural, motor, and vascular function.  The patient did complain of some  sciatic type pain down the right leg,  but no pain in the groin.  The patient  had hyponatremia on admission at 129, improved to 134 with the holding of  the patient's hydrochlorothiazide.  The patient otherwise had no other  significant complications.  He worked well with physical therapy and was  felt ready for discharge on postop day #3.  Arrangements were made, and he  was discharged to home in good condition.   LABORATORY DATA:  1. Chest x-ray on admission showed no active acute cardiopulmonary disease.  2. CBC on October 11 showed WBC 6.5, hemoglobin 10.1, hematocrit 30.6,     platelets 204.  3. Sodium on admission was 129 with routine chemistries on October 10,     sodium 134, potassium 4.4, glucose 138, BUN 20, creatinine 1.2, high 2.2.  4. Routine urinalysis was normal.   DISCHARGE MEDICATIONS:  1. Arixtra 2.5 mg subcutaneously q.d.  2. Avapro 300 mg p.o. q.d.  3. Hydrochlorothiazide 12.5.  4. Nicotine patch 21 mg q.24h.  5. Colace 100 mg p.o. b.i.d.  6. Senna 8.6 mg p.o. b.i.d.  7. Laxative and enema of choice p.r.n.  8. Reglan 10 mg p.o. q.8h. p.r.n.  9. Percocet 5 mg 1 or 2 tablets every 4-6h. p.r.n. pain.  10.      Tylenol 650 mg p.o. q.6h. p.r.n.  11.      Robaxin 500 mg p.o.  q.6h. p.r.n.  12.      Restoril 15 mg p.o. q.h.s. p.r.n.   DISCHARGE INSTRUCTIONS:  1. Medications:  The patient is to resume routine home medications>  2. Oxy-Contin 10 mg 1 tablet twice a day for pain.  3. Percocet 5 mg 1 or 2 tablets every 4 to 6 hours for pain if needed.  4. Arixtra 2.5 mg 1 injection for the next 3 days.  5. Activity: Touchdown weightbearing on right leg as instructed by physical     therapist with the use of a walker.  6. Diet: No restrictions.  7. Wound care:  Keep wound clean and dry. Check daily for any signs of     infection.  Call physician for any increased pain, swelling,redness,     drainage, or temperature greater than 101.5.  8. Followup:  Call for followup with Dr. Cleophas Dunker on the following Monday.   CONDITION ON DISCHARGE:  The patient's condition upon discharge to home is  good.      Jamelle Rushing, P.A.                      John L. Priscille Kluver, M.D.    RWK/MEDQ  D:  03/25/2002  T:  03/25/2002  Job:  [id number]

## 2010-09-29 NOTE — Letter (Signed)
July 10, 2006    Mr. Francesco Sor   RE:  RAYNE, LOISEAU  MRN:  [id number]  /  DOB:  22-Sep-1940   Dear Mr. Mangel:   It is my pleasure to have treated you recently as a new patient in my  office.  I appreciate your confidence and the opportunity to participate  in your care.   Since I do have a busy inpatient endoscopy schedule and office schedule,  my office hours vary weekly.  I am, however, available for emergency  calls every day through my office.  If I cannot promptly meet an urgent  office appointment, another one of our gastroenterologists will be able  to assist you.   My well-trained staff are prepared to help you at all times.  For  emergencies after office hours, a physician from our gastroenterology  section is always available through my 24-hour answering service.   While you are under my care, I encourage discussion of your questions  and concerns, and I will be happy to return your calls as soon as I am  available.   Once again, I welcome you as a new patient and I look forward to a happy  and healthy relationship.    Sincerely,      [name], MD,FACG  Electronically Signed   RDK/MedQ  DD: 07/10/2006  DT: 07/10/2006  Job #: [id number]

## 2010-09-29 NOTE — Op Note (Signed)
NAME:  [name], [name]                           ACCOUNT NO.:  [id number]   MEDICAL RECORD NO.:  [id number]                   PATIENT TYPE:  INP   LOCATION:  5018                                 FACILITY:  [facility]   PHYSICIAN:  John L. Rendall III, M.D.           DATE OF BIRTH:  [birth year]   DATE OF PROCEDURE:  02/18/2002  DATE OF DISCHARGE:                                 OPERATIVE REPORT   PREOPERATIVE DIAGNOSIS:  Osteoarthritis, right hip.   POSTOPERATIVE DIAGNOSIS:  Osteoarthritis, right hip.   PROCEDURE:  Right AML total hip.   SURGEON:  John L. Rendall, M.D.   ASSISTANT:  Legrand Pitts. Duffy, P.A.   ANESTHESIA:  General.   OPERATIVE TIME:  1 hour 15 minutes.   ESTIMATED BLOOD LOSS:  450 cc.   PATHOLOGY:  The patient has end-stage osteoarthritis, right hip, with a  chronic flexion contracture about 25 degrees.  He has recently undergone  left total hip replacement.   DESCRIPTION OF PROCEDURE:  Under general anesthesia, the patient is placed  in the left lateral decubitus position and the right hip was prepared with  Duraprep and draped as a sterile field.  A posterior approach is made  through a 15 cm incision, dissection is carried through the IT band, a  Charnley retractor is inserted, and the short external rotators and hip  capsule are taken down with electrocautery.  The hip capsule is opened in a  T-shaped manner.  Multiple small vessels are cauterized.  The hip is  dislocated.  The femoral head is seen to be deformed.  At this point the  superior femoral neck is exposed.  An IM initiator and canal finder are  used.  The femoral canal is then sequentially reamed to 16 mm for a 16.5  AML.  The femoral neck is then osteotomized about 1.5 cm above the lesser  trochanter.  The femoral canal is then rasped progressively with 12, 13.5,  15, and 16.5 rasps, and the calcar is reamed.  Following this, attention is  turned to the acetabulum.  The acetabulum is exposed with three  wing  retractors superiorly, two cobras inferiorly.  The capsule is preserved, the  labrum is excised.  The acetabulum shows extensive wear.  It is  progressively reamed and deepened about 5-6 mm up to a 55 mm reamer.  A 56  trial gives an excellent rim fit, and a 300 Series tri-spike AML cup is then  inserted using the external guide to help position it.  Following this,  trial reduction off the rasp of an angled 10-degree poly, a 32 hip ball at a  +5 neck length is done off a rasp.  It reveals excellent fit, alignment, and  stability.  Permanent components are then obtained and an apex hole  eliminator is used.  The 32 mm crosslinked poly is inserted for the 54 cup.  The 16.5 AML  hip is then used and the +5 neck length, 32 mm hip ball.  Once  all permanent components are in, the hip is reduced.  It is stable through  normal range of motion.  There is slight shuck with pulling on leg length.  There is a slight flexion contracture, which is stretched out on the table.  A slight release of the hip flexors is done with the key elevated on the  anterior femoral neck.  At  this point the wound is irrigated with antibiotic solution and closed in  layers, with #1 Tycron closing the hip capsule, reattaching the piriformis  and short external rotators, and then closing the IT band.  Subcu is closed  with 0 and 2-0 Vicryl, and the skin with clips.  The patient tolerated the  procedure well and returned to recovery in good condition.                                               John L. Dorothyann Gibbs, M.D.    Renato Gails  D:  02/18/2002  T:  02/18/2002  Job:  [id number]

## 2010-09-29 NOTE — H&P (Signed)
NAME:  [name], [name]                           ACCOUNT NO.:  [id number]   MEDICAL RECORD NO.:  [id number]                   PATIENT TYPE:  INP   LOCATION:  NA                                   FACILITY:  [facility]   PHYSICIAN:  John L. Rendall, M.D.               DATE OF BIRTH:  [birth year]   DATE OF ADMISSION:  02/16/2002  DATE OF DISCHARGE:                                HISTORY & PHYSICAL   CHIEF COMPLAINT:  Right hip pain.   HISTORY OF PRESENT ILLNESS:  The patient is a 70 year old white male with a  history of bilateral hip pain for the last one year.  The patient had a left  total hip arthroplasty performed in June of 2003 with good results.  The  patient, in his right knee, has progressed and worsened with time.  He does  have problems with ambulation and changing positions from sitting to  standing, going up and down stairs and in and out of a car.  The patient  describes the pain as a deep sharp pain located in the lateral aspect of the  thigh around into the buttocks region.  The pain worsens with any type of  weightbearing activity.  He has difficulty with sleep at night.  He does  have popping and grinding in the hip on occasional weightbearing activities.   ALLERGIES:  No known drug allergies.   CURRENT MEDICATIONS:  1. Diovan 160/12.5 mg p.o. every day.  2. Aleve one or two tablets p.o. every day.   PAST MEDICAL HISTORY:  1. Hypertension.  2. Smoking.  3. ETOH use.   PAST SURGICAL HISTORY:  1. Bilateral total knee arthroplasties, one revision of right knee.  2. Bilateral cataracts/implants.  3. Left total hip arthroplasty.   The patient denies any complications to the above-mentioned surgical  problems.   SOCIAL HISTORY:  The patient is a 70 year old white male with a long-time  history of one-pack-a-day smoking history.  He does drink one to two beers a  day.  He is currently married.   FAMILY MEDICAL HISTORY:  Mother is deceased due to old age.  Father is  deceased with complications of a hip fracture after a fall.  The patient has  got one brother deceased from gunshot wound, one brother deceased from a  CVA, one sister deceased from lung cancer and two sisters deceased from  complications of diabetes.  The patient has got one sister alive with  hypertension.   REVIEW OF SYSTEMS:  Review of systems is positive for upper and lower  dentures.  He does have bilateral corneal implants.  He does have occasional  heartburn and indigestion for which he uses Tums.  All other review of  system categories are negative.   PHYSICAL EXAMINATION:  VITAL SIGNS:  Height is 6 feet even.  Weight is 188  pounds.  Pulse of 98 and regular.  Temperature is 98.0.  Respirations 18.  Blood pressure is 148/22.  GENERAL:  This is a healthy-appearing, very tan, well-developed male.  He  does ambulate with a right-sided limp.  He does not use a cane.  He is able  to get on and off the exam table without any difficulty.  HEENT:  Head was normocephalic, atraumatic and nontender over maxillary and  frontal sinuses.  Pupils are equal, reactive, accommodating to light.  Extraocular movements intact.  Sclerae are not icteric.  Conjunctivae are  pink and moist.  External ears were without deformities.  Canals patent.  TMs pearly gray and intact.  Nasal septum midline.  Mucous membranes pink  and moist with polyps noted.  Oral buccal mucosa is pink and moist without  lesions.  Dentures were in place.  NECK:  Neck was supple.  No palpable lymphadenopathy.  Thyroid gland was  nontender.  The patient had good range of motion of his cervical spine  without any difficulty or tenderness.  He had no tenderness with percussion  along the entire spinal column.  CHEST:  Lung sounds were clear.  He had a significantly longer external  phase compared to internal phase.  No wheezes, rales or rhonchi were noted.  HEART:  Regular rate and rhythm.  S1 and S2 were auscultated.  No  murmurs,  rubs, or gallops noted.  ABDOMEN:  Abdomen was round, soft and nontender.  Bowel sounds were  normoactive throughout.  No hepatosplenomegaly palpable.  CVA area was  nontender to percussion.  EXTREMITIES:  Upper extremities were symmetrically sized and shaped with  excellent range of motion of the shoulders, elbows and wrists without any  difficulty and motor strength was 5/5 in all muscle groups tested.  Left hip  had a lateral incision.  He was able to fully extend and flex up to 100  degrees with 10 degrees internal and external rotation without any  difficulty.  Right hip had full extension, flexion up to 110 degrees.  He  had 10 degrees internal and external rotation without any mechanical  symptoms but locking due to discomfort and mechanical blocking.  Bilateral  knees had well-healed anterior midline incisions; exam was normal.  Range of  motion was full back to 115 degrees.  They were not unstable.  Calves were  nontender.  Ankles were symmetrical with good dorsiflexion and  plantarflexion.  PERIPHERAL VASCULAR:  Carotid pulses 2+.  Radial pulses 2+.  Dorsalis pedis  and posterior tibial pulses were 1+.  No lower extremity edema or venous  stasis changes noted.  No carotid bruits noted.  NEUROLOGIC:  The patient was conscious, alert and appropriate, held easy  conversation with examiner.  Cranial nerves II-XII were grossly intact.  Deep tendon reflexes of the upper and lower extremities were symmetrical at  2+.  The patient was grossly intact to light touch sensation from head to  toe.  BREASTS:  Exam deferred at this time.  RECTAL:  Exam deferred at this time.  GU:  Exam deferred at this time.   IMPRESSION:  1. End-stage osteoarthritis of right hip.  2. Tobacco use.  3. Alcohol use.  4. Hypertension.   PLAN:  The patient will be admitted to [facility] on February 16, 2002 under the care of Dr. Jonny Ruiz L. Rendall.  The patient will undergo all  routine labs  and tests prior to going through a right total hip  arthroplasty.     Jamelle Rushing, P.A.  John L. Priscille Kluver, M.D.    RWK/MEDQ  D:  02/10/2002  T:  02/13/2002  Job:  [id number]

## 2010-09-29 NOTE — Op Note (Signed)
[name], [name] NO.:  [id number]   MEDICAL RECORD NO.:  [id number]          PATIENT TYPE:  INP   LOCATION:  2899                         FACILITY:  [facility]   PHYSICIAN:  Ardeth Sportsman, MD     DATE OF BIRTH:  1940/08/05   DATE OF PROCEDURE:  08/12/2006  DATE OF DISCHARGE:                               OPERATIVE REPORT   SURGEON:  Ardeth Sportsman, MD.   GASTROENTEROLOGIST:  [name], MD, Clementeen Graham.   ASSISTANT:  Currie Paris, MD.   PREOPERATIVE DIAGNOSIS:  Distal sigmoid mass with adenoma, probable  carcinoma.   POSTOPERATIVE DIAGNOSIS:  Distal sigmoid mass with adenoma, probable  carcinoma.   PROCEDURES PERFORMED:  1. Rigid proctoscopy.  2. Laparoscopic assisted low anterior resection of sigmoid colon and      proximal rectum.  3. Splenic flexure mobilization of the transverse and descending      colon.   ANESTHESIA:  1. General anesthesia.  2. Local anesthetic in a field block around all incisions.   SPECIMENS:  1. Sigmoid colon and proximal rectum: distal end is stapled, proximal      end is open.  2. Anastomotic rings: distal ring has a silk stitch around it.   DRAINS:  None.   ESTIMATED BLOOD LOSS:  50 mL.   COMPLICATIONS:  No major complications.   INDICATIONS:  Mr. Mcconathy is a 70 year old gentleman, who had noted some  rectal bleeding and was found to have a mass about 20 cm from the anus  by colonoscopy, with biopsies showing adenoma and some suspicion for  carcinoma.  He was sent to me for evaluation.  Pathophysiology of  adenomatous polyps and the risks of colon cancer, especially given its  size of 4 cm, and suspicious polyps at pathology were discussed.  Options were discussed and recommendation was made for a  laparoscopically assisted sigmoid colectomy, possible low anterior  resection.  The risks of surgery, such as stroke, heart attack, deep  vein thrombosis, pulmonary embolism and death, were discussed.  The  patient was placed on a beta blocker preoperatively for about 2 weeks.  The risks such as bleeding, need for transfusion, wound infection,  abscess, wound dehiscence, incisional hernia, postoperative pain,  urinary retention, prolonged hospital stay and ileus, bowel obstruction,  anastomotic leak resulting in need for an ostomy, and others were  discussed.  Options were discussed, questions answered and he and his  family wished to proceed.   OPERATIVE FINDINGS:  He had a somewhat tortuous corkscrew sigmoid colon  with some mild lateral wall adhesions.  He had a distal mass that felt  about the size of a ping pong ball, but had not eaten through bowel  wall, about 15cm from the anus on rigid proctoscopy.  He did not have  any grossy positive lymph nodes.  His liver and peritoneal surfaces  appeared to be normal.  There was no evidence of any metastatic disease.   DESCRIPTION OF PROCEDURES:  Informed consent was confirmed.  The patient  had already had a bowel prep.  He received  preoperative IV cefoxitin.  He underwent general anesthesia without difficulty.  He was positioned  in low lithotomy position with the arms tucked, and care was made,  especially given his recent history of hip and knee surgery.  His  perineum was also prepped and draped in a sterile fashion.   Entry was gained into the abdomen through a 5-mm optical entry in the  right upper quadrant using a 5-mm, 0-degree scope.  Capnoperitoneum to  15 mmHg provided good abdominal insufflation.  Diagnostic laparoscopy  was performed and confirmed no evidence of any peritoneal or liver  masses, or any other abnormalities or concerns.  The capnoperitoneum was  evacuated.   An 8-cm Pfannenstiel incision was made 2 fingerbreadths above the pubic  bone in the lower midline.  Cautery was used for this transverse  incision into the anterior rectus fascia.  It was opened up  transversely.  It was freed off the linea alba proximally  and cephalad  and caudad.  The entry was gained into the peritoneal cavity through the  midline peritoneum.  An Insurance underwriter was placed.  Capnoperitoneum was  reintroduced.  Under direct visualization, a 5-mm port was placed in the  left midabdomen, and a 10-mm port was placed in the periumbilical  region, and a 12-mm port was placed in the right lower quadrant.   The small bowel and greater omentum were swept towards the upper abdomen  and towards the right side with the patient in steep Trendelenburg in  right-side-down position.  The sigmoid colon could be found.  It was  somewhat twisted and had some moderate sidewall adhesions.  Ultimately,  I could follow the sigmoid mesentery down to the level of the sacral  promontory.  Cautery was used to help score the  mesentery at the level  of the sacral promontory and follow and score the mesentery about a  centimeter away from the aorta, cephalad all the way up to the ligament  of Treitz.  Care was made to help avoid any injury to any structures.  The retroperitoneum was gently pushed off the mesentery of the sigmoid  and desecending colon, and the ureter and gonadal vessels could be  easily identified and these were preserved at all times.  Ultimately, I  was able into get to the retromesenteric pocket and free the sigmoid  mesentery off its retroperitoneal attachments from the pelvic reflection  all the way up the transverse colon up to the midline.  In doing this, I  noted a structure going from the aorta to the descending colon,  consistent with the inferior mesenteric artery.  Care was made to  skeletonize this as much as possible and preserve nerves.  It was  ligated using a LigaSure about 1 cm from the origin of the aorta.  This  was followed more cephalad, and an inferior mesenteric vein was found,  and it was cephalad to the ligament of Treitz.  This was further skeletonized out and ligated.  Of note, the IMV was somewhat  shrunken  and withered.  This provided good medial-to-lateral mobilization.   The left colon was freed off the lateral sidewalls using controlled  cautery and tension to help do a lateral-to-medial mobilization.  This  was easily done, as we only had to free around the line of Toldt from  the pelvic reflection all the way up towards the splenic flexure.   Splenic flexure mobilization was carefully done.  The splenic flexure of  the colon actually  was extremely close to the spleen, and care was made  to do controlled and focused dissection to help free the sigmoid colon  off the spleen.  The spleen was not injured or bleeding.  The distal  half of the transverse colon was freed off the greater omentum on direct  visualization using a LigaSure.  Care was made to preserve the colonic  mesentery.  There were some attachments of the colonic mesentery to the  inferior pancreatic rim, and these were carefully skeletonized and freed  off as well, just gently freeing off attachments and avoiding any  vascular structures.  This provided excellent mobilization.   Next, pelvic dissection was done.  The sigmoid colon was freed from its  lateral attachments and this helped it straighten out and be able to  reduce out of the pelvis.  I was able to free off some of the sidewall  attachments at the rectosigmoid junction, using primarily some cautery,  as well as some LigaSure.  Care was made to identify and preserve the  ureter and gonadal structures at all times.  This provided good  mobilization.  The mesorectum of the proximal rectum was skeletonized.  This was bone on done sides and circumferentially.   A Contour TI stapler, was used to staple and transact distally about 5  cm from the obvious mass in the mid rectum.  With this transection, the  capnoperitoneum was evacuated, the GelPort was removed, and I was able  to evacuate the specimen.  It was again confirmed that we did have a  good nice  high ligation of the inferior mesenteric arteries and vein.  Measurement was made and we had good mobilization.  Therefore, I decided  to take the mesentery of the colon near the mid-to-distal descending  colon.  This was done with LigaSure in a ray-like fashion.  The bowel  clamp was used and the colon was transected proximally.  It was sent as  specimen; it was sent for analysis.  I initially tried to use a  pursestring appliance and a Mellody Dance needle to help provide a pursestring  around the proximal open colon; however, the appliance misfired, and,  therefore, I ended up using a 0 Prolene running pursestring fashion.  EEA sizers were used, and this easily allowed a 33, and that was used.  An anvil was placed into the proximal end in the proximal open colon and  the stitch was tied down.  Some slight fat and epiploic appendages were  freed off, slightly skeletonizing, taking care to avoid any injury to  the colon.  Dr. Jamey Ripa went below, and after general anorectal dilation, was able to  pass the stapler (33) in under direct visualization.  A spike was  brought out through the middle of the staple line in the rectal stump.  The anvil was attached to the spike of the stapler.  Reinspection noted  that the colon was not twisted or torsed.  It was brought down well,  held for 10 seconds, fired and removed intact.  Copious irrigation was  done down to the pelvis.  The anastomotic rings were inspected and were  nice, large and intact.  They were sent for evaluation with a silk  stitch around the distal anastomotic ring.   The anastomosis was tested by using a bowel clamp to gently clamp  proximal to the anastomosis.  When Dr. Jamey Ripa performed rigid  proctoscopy, he noted that the anastomosis was nice and intact, with no  evidence of any bleeding or any obvious leak of air.  It was inflated  and held insufflation well.  At one point, we saw a couple tiny dots of  bubbles, but on several  reinspections and care, we never had that  reproduced.  The rigid proctoscopy was finished.   The GelPort cap was replaced and capnoperitoneum was reintroduced.  A  careful inspection was made laparoscopically & circumferentially around  the circular staple line, as well as the rectal stump.  And I used a  [place] to help probe to make sure there was no evidence of any  weakness or defect.  There was none, and the anastomosis looked nice and  healthy and intact.  Again, the sigmoid colon was not twisted or  torqued.  Inspection revealed no blood up in the splenic flexure or in  the abdomen, and the pelvis return was nice and clear.  Copious  irrigation was done with a nice clear result.  Under direct  visualization, the umbilical right lower quadrant ports were closed  using a 0 Vicryl using a laparoscopic suture passer.  Capnoperitoneum  was evacuated.  The ports and GelPort were removed.  In the Pfannenstiel  incision, the peritoneum was closed using 2-0 Vicryl in a running  fashion vertically, and the rectus fascia was reapproximated  transversely using a #1 PDS in a looped fashion to good result.  Copious  irrigation was done and the skin was closed in all incisors using 4-0  Monocryl.  A sterile stitch was applied.  The patient was extubated and  sent to the recovery room in stable condition.   I explained the operative findings to the patient's family and  expectations.  Postoperative instructions written instructions were  given.  Questions were answered and they expressed understanding and  appreciation.      Ardeth Sportsman, MD  Electronically Signed     SCG/MEDQ  D:  08/12/2006  T:  08/12/2006  Job:  [id number]   cc:   Tammy R. Collins [place], M.D.  [name], MD,FACG

## 2010-09-29 NOTE — Assessment & Plan Note (Signed)
[facility]                         GASTROENTEROLOGY OFFICE NOTE   [name], [name]                        MRN:          [id number]  DATE:07/10/2006                            DOB:          January 30, 1941    REASON FOR CONSULTATION:  Rectal bleeding.   [name] is a pleasant 70 year old white male, referred through the  courtesy of Dr. Yehuda Budd for evaluation.  Over the last few weeks he has  noted intermittent bright red blood per rectum, consisting of blood  mixed with his stools.  He denies rectal or abdominal pain.  He is  currently on therapy for H. pylori antibody positivity.  He denies  change in bowel habits.  He has occasional upper abdominal discomfort.   PAST MEDICAL HISTORY:  1. Hypertension.  2. Status post hip and knee replacement.   FAMILY HISTORY:  Noncontributory.   MEDICATIONS:  Lisinopril/HCTZ, Prevacid, Biaxin, amoxicillin.   HE HAS NO ALLERGIES.   He smoked a pack a day, quit 2 months ago.  He quit drinking as well,  though denies heavy use.  He is married.   REVIEW OF SYSTEMS:  Positive for joint pains.   PHYSICAL EXAMINATION:  Pulse 72, blood pressure 122/68, weight 202.  HEENT: EOMI. PERRLA. Sclerae are anicteric.  Conjunctivae are pink.  NECK:  Supple without thyromegaly, adenopathy or carotid bruits.  CHEST:  Inspiratory wheezing and expiratory wheezing.  CARDIAC:  Regular rhythm; normal S1 S2.  There are no murmurs, gallops  or rubs.  ABDOMEN:  Bowel sounds are normoactive.  Abdomen is soft, non-tender and  non-distended.  There are no abdominal masses, tenderness, splenic  enlargement or hepatomegaly.  EXTREMITIES:  Full range of motion.  No cyanosis, clubbing or edema.  RECTAL:  Deferred   June 27, 2006 hemoglobin 13, hematocrit 37, MCV 85.   IMPRESSION:  1. Limited rectal bleeding - rule out bleeding sources including      hemorrhoids, polyps, AVMs and neoplasm.  2. Probable chronic obstructive pulmonary  disease.  3. Hypertension.   RECOMMENDATION:  Colonoscopy.     [name], [name],FACG  Electronically Signed    RDK/MedQ  DD: 07/10/2006  DT: 07/10/2006  Job #: [id number]   cc:   Tammy R. Collins [place], M.D.

## 2010-10-11 ENCOUNTER — Encounter: Payer: Self-pay | Admitting: Pulmonary Disease

## 2010-10-11 ENCOUNTER — Telehealth: Payer: Self-pay | Admitting: Pulmonary Disease

## 2010-10-11 NOTE — Telephone Encounter (Signed)
PFT reviewed.  Will have my nurse inform patient that breathing test showed expected changes of COPD and asthma, but no change in test results since 2011.  Also no evidence that amiodarone is causing trouble with his breathing.  No change to current treatment plan.

## 2010-10-12 NOTE — Telephone Encounter (Signed)
I informed pt of VS's findings and recommendations. Pt verbalized understanding  

## 2010-10-17 ENCOUNTER — Encounter: Payer: Self-pay | Admitting: Pulmonary Disease

## 2010-10-21 ENCOUNTER — Encounter: Payer: Self-pay | Admitting: Gastroenterology

## 2010-10-31 ENCOUNTER — Ambulatory Visit (INDEPENDENT_AMBULATORY_CARE_PROVIDER_SITE_OTHER): Payer: Medicare Other | Admitting: Thoracic Surgery

## 2010-10-31 ENCOUNTER — Other Ambulatory Visit: Payer: Self-pay | Admitting: Thoracic Surgery

## 2010-10-31 ENCOUNTER — Ambulatory Visit
Admission: RE | Admit: 2010-10-31 | Discharge: 2010-10-31 | Disposition: A | Discharge status: Home / Self Care | Payer: Medicare Other | Source: Ambulatory Visit | Attending: Thoracic Surgery | Admitting: Thoracic Surgery

## 2010-10-31 DIAGNOSIS — C3490 Malignant neoplasm of unspecified part of unspecified bronchus or lung: Secondary | ICD-10-CM

## 2010-10-31 DIAGNOSIS — R911 Solitary pulmonary nodule: Secondary | ICD-10-CM

## 2010-11-09 ENCOUNTER — Encounter (HOSPITAL_COMMUNITY): Payer: Self-pay

## 2010-11-09 ENCOUNTER — Encounter (HOSPITAL_COMMUNITY)
Admission: RE | Admit: 2010-11-09 | Discharge: 2010-11-09 | Disposition: A | Discharge status: Home / Self Care | Payer: Medicare Other | Source: Ambulatory Visit | Attending: Thoracic Surgery | Admitting: Thoracic Surgery

## 2010-11-09 DIAGNOSIS — Z79899 Other long term (current) drug therapy: Secondary | ICD-10-CM | POA: Insufficient documentation

## 2010-11-09 DIAGNOSIS — R911 Solitary pulmonary nodule: Secondary | ICD-10-CM

## 2010-11-09 DIAGNOSIS — C3490 Malignant neoplasm of unspecified part of unspecified bronchus or lung: Secondary | ICD-10-CM | POA: Insufficient documentation

## 2010-11-09 DIAGNOSIS — J984 Other disorders of lung: Secondary | ICD-10-CM | POA: Insufficient documentation

## 2010-11-09 LAB — GLUCOSE, CAPILLARY: Glucose-Capillary: 105 mg/dL — ABNORMAL HIGH (ref 70–99)

## 2010-11-09 MED ORDER — FLUDEOXYGLUCOSE F - 18 (FDG) INJECTION
18.3000 | Freq: Once | INTRAVENOUS | Status: AC | PRN
  Administered 2010-11-09: 18.3 via INTRAVENOUS

## 2010-11-11 ENCOUNTER — Encounter: Payer: Self-pay | Admitting: Pulmonary Disease

## 2010-11-11 ENCOUNTER — Telehealth: Payer: Self-pay | Admitting: Pulmonary Disease

## 2010-11-11 NOTE — Telephone Encounter (Signed)
Auto-CPAP download 10/02/10 to 10/31/10>>Average 8hrs 14 min.  Average AHI 4.8.  Will have my nurse inform pt that CPAP report looked good.  No change to current set up.

## 2010-11-14 ENCOUNTER — Ambulatory Visit (INDEPENDENT_AMBULATORY_CARE_PROVIDER_SITE_OTHER): Payer: Medicare Other | Admitting: Thoracic Surgery

## 2010-11-14 DIAGNOSIS — C3490 Malignant neoplasm of unspecified part of unspecified bronchus or lung: Secondary | ICD-10-CM

## 2010-11-14 NOTE — Assessment & Plan Note (Signed)
OFFICE VISIT  [name], [name] DOB:  [birth year]                                        November 14, 2010 CHART #:  [id number]  HISTORY OF PRESENT ILLNESS:  [name] came from followup today and we were concerned about his new lesion in the right midlung field.  Since he has a history of lung cancer in the past, we did a PET scan on him and the PET scan was stable.  There was a slightly irregular a 9-mm nodule and it was negative on PET.  So, we planned to see him back again in 4 months with another CT scan at that time if it is still there or has increased in size, then we will have to proceed with resection.  He understands the plan.  PHYSICAL EXAMINATION:  VITAL SIGNS:  His blood pressure is 141/79, pulse 64, respirations 22, and sats were 97%. LUNGS:  Clear to auscultation.  Ines Bloomer, M.D. Electronically Signed  DPB/MEDQ  D:  11/14/2010  T:  11/14/2010  Job:  [id number]

## 2010-11-14 NOTE — Telephone Encounter (Signed)
Called, spoke with pt.  He was informed CPAP report looked good and no change to current set up per Dr. Craige Cotta.  He verbalized understanding of this.

## 2010-11-16 NOTE — Assessment & Plan Note (Signed)
OFFICE VISIT  [name], Jonathan DOB:  09-Mar-1941                                        October 31, 2010 CHART #:  [id number]  The patient returns and is now three and a half years since his surgery for seeds in the right side.  He is doing well.  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  CT scan now shows a 8.7-mm lesion in the right side in the right upper lobe.  I went ahead and ordered a PET scan on this since this is new from 6 months ago.  He is going to have a skin cancer removed by his dermatologist on his back.  His blood pressure is 124/74, pulse 70, respirations 20, sats were 92%.  I will see him back again after the PET scan.  Ines Bloomer, M.D. Electronically Signed  DPB/MEDQ  D:  10/31/2010  T:  11/01/2010  Job:  [id number]

## 2010-11-27 ENCOUNTER — Other Ambulatory Visit: Payer: Self-pay | Admitting: Cardiology

## 2011-01-12 ENCOUNTER — Other Ambulatory Visit: Payer: Self-pay | Admitting: *Deleted

## 2011-01-12 ENCOUNTER — Other Ambulatory Visit: Payer: Medicare Other | Admitting: Gastroenterology

## 2011-01-12 MED ORDER — DABIGATRAN ETEXILATE MESYLATE 150 MG PO CAPS: 150.0000 mg | ORAL_CAPSULE | Freq: Two times a day (BID) | ORAL | Status: DC

## 2011-01-18 ENCOUNTER — Ambulatory Visit (AMBULATORY_SURGERY_CENTER): Payer: Medicare Other | Admitting: *Deleted

## 2011-01-18 VITALS — Ht 72.0 in | Wt 236.0 lb

## 2011-01-18 DIAGNOSIS — Z1211 Encounter for screening for malignant neoplasm of colon: Secondary | ICD-10-CM

## 2011-01-18 NOTE — Progress Notes (Signed)
[name] is on Pradaxa and has multiple medical problems.  He may need Propolfol.  I made a NP appt for him to see Dr. Arlyce Dice 104/12.  Colon on 02/01/11 cancelled.  Epic chart updated.  He does not have his prep instructions but he does have The information handouts.  Wyona Almas

## 2011-01-29 ENCOUNTER — Other Ambulatory Visit: Payer: Self-pay | Admitting: Cardiology

## 2011-02-01 ENCOUNTER — Other Ambulatory Visit: Payer: Medicare Other | Admitting: Gastroenterology

## 2011-02-06 ENCOUNTER — Other Ambulatory Visit: Payer: Self-pay | Admitting: *Deleted

## 2011-02-06 MED ORDER — AMIODARONE HCL 200 MG PO TABS: 200.0000 mg | ORAL_TABLET | Freq: Every day | ORAL | Status: DC

## 2011-02-07 ENCOUNTER — Ambulatory Visit: Payer: Medicare Other | Admitting: Pulmonary Disease

## 2011-02-07 ENCOUNTER — Other Ambulatory Visit: Payer: Self-pay | Admitting: Cardiology

## 2011-02-14 ENCOUNTER — Other Ambulatory Visit: Payer: Self-pay | Admitting: Cardiology

## 2011-02-14 MED ORDER — BISOPROLOL FUMARATE 5 MG PO TABS: 5.0000 mg | ORAL_TABLET | Freq: Every day | ORAL | Status: DC

## 2011-02-15 ENCOUNTER — Encounter: Payer: Self-pay | Admitting: Gastroenterology

## 2011-02-15 ENCOUNTER — Ambulatory Visit (INDEPENDENT_AMBULATORY_CARE_PROVIDER_SITE_OTHER): Payer: Medicare Other | Admitting: Gastroenterology

## 2011-02-15 DIAGNOSIS — Z800 Family history of malignant neoplasm of digestive organs: Secondary | ICD-10-CM

## 2011-02-15 DIAGNOSIS — Z8500 Personal history of malignant neoplasm of unspecified digestive organ: Secondary | ICD-10-CM | POA: Insufficient documentation

## 2011-02-15 DIAGNOSIS — Z860100 Personal history of colon polyps, unspecified: Secondary | ICD-10-CM | POA: Insufficient documentation

## 2011-02-15 MED ORDER — PEG-KCL-NACL-NASULF-NA ASC-C 100 G PO SOLR: 1.0000 | Freq: Once | ORAL | Status: DC

## 2011-02-15 NOTE — Assessment & Plan Note (Signed)
Plan a followup colonoscopy. Pradaxa will be held if approved by his PCP

## 2011-02-15 NOTE — Patient Instructions (Addendum)
You have been scheduled for a colonoscopy with propofol. Please follow written instructions given to you at your visit today.  Please pick up your Moviprep kit at the pharmacy within the next 2-3 days. You will be contaced by our office prior to your procedure for directions on holding your Pradaxa.  If you do not hear from our office 1 week prior to your scheduled procedure, please call [phone] to discuss. CC: Dr Herb Grays

## 2011-02-15 NOTE — Assessment & Plan Note (Signed)
Plan followup colonoscopy 

## 2011-02-15 NOTE — Progress Notes (Signed)
History of Present Illness:  [name] has returned for followup of colon cancer. In 2008 he underwent left hemicolectomy for colon cancer. Followup colonoscopy in 2009 demonstrated adenomatous polyps.  He has no GI complaints including change of bowel habits, abdominal pain, melena or hematochezia.  The patient has coronary artery disease and is on pradaxa.    Review of Systems: He complains of chronic fatigue. Pertinent positive and negative review of systems were noted in the above HPI section. All other review of systems were otherwise negative.    Current Medications, Allergies, Past Medical History, Past Surgical History, Family History and Social History were reviewed in [employer] electronic medical record  Vital signs were reviewed in today's medical record. Physical Exam: General: Well developed , well nourished, no acute distress Head: Normocephalic and atraumatic Eyes:  sclerae anicteric, EOMI Ears: Normal auditory acuity Mouth: No deformity or lesions Lungs: Clear throughout to auscultation Heart: Regular rate and rhythm; no murmurs, rubs or bruits Abdomen: Soft, non tender and non distended. No masses, hepatosplenomegaly or hernias noted. Normal Bowel sounds Rectal:deferred Musculoskeletal: Symmetrical with no gross deformities  Pulses:  Normal pulses noted Extremities: No clubbing, cyanosis, edema or deformities noted Neurological: Alert oriented x 4, grossly nonfocal Psychological:  Alert and cooperative. Normal mood and affect

## 2011-02-15 NOTE — Assessment & Plan Note (Signed)
Patient was instructed to advise his children to undergo screening colonoscopy

## 2011-02-18 NOTE — Progress Notes (Signed)
OK to hold Pradaxa for colonoscopy.  No history of stroke.  Would hold 3 days prior.  [name] [employer]

## 2011-02-19 ENCOUNTER — Telehealth: Payer: Self-pay | Admitting: *Deleted

## 2011-02-19 NOTE — Telephone Encounter (Signed)
OK TO HOLD PRADAXA 3 DAYS PRIOR PER DR MCLEAN, SEE EPIC NOTES PT AWARE

## 2011-02-20 LAB — BASIC METABOLIC PANEL
BUN: 20
Calcium: 8.6
Chloride: 90 — ABNORMAL LOW
Creatinine, Ser: 0.93
GFR calc Af Amer: >60

## 2011-02-21 LAB — CBC
HCT: 33 — ABNORMAL LOW
HCT: 34.9 — ABNORMAL LOW
HCT: 37.4 — ABNORMAL LOW
Hemoglobin: 10.5 — ABNORMAL LOW
Hemoglobin: 12.8 — ABNORMAL LOW
MCHC: 34.3
MCV: 83.9
MCV: 84.7
MCV: 85.1
Platelets: 220
Platelets: 239
RBC: 3.93 — ABNORMAL LOW
RBC: 4.39
RDW: 13.8
RDW: 14
WBC: 8.5

## 2011-02-21 LAB — CK TOTAL AND CKMB (NOT AT ARMC)
CK, MB: 5.1 — ABNORMAL HIGH
Relative Index: 1.5
Total CK: 342 — ABNORMAL HIGH

## 2011-02-21 LAB — BLOOD GAS, ARTERIAL
Acid-Base Excess: 0.9
Acid-Base Excess: 1.6
Bicarbonate: 23.7
Drawn by: 129711
O2 Saturation: 94.8
O2 Saturation: 96.9
Patient temperature: 98.6
TCO2: 25.1
TCO2: 26.5
pCO2 arterial: 37.7
pCO2 arterial: 44.8
pH, Arterial: 7.343 — ABNORMAL LOW
pO2, Arterial: 78.6 — ABNORMAL LOW
pO2, Arterial: 89

## 2011-02-21 LAB — URINALYSIS, ROUTINE W REFLEX MICROSCOPIC
Glucose, UA: NEGATIVE
Ketones, ur: NEGATIVE
Protein, ur: NEGATIVE

## 2011-02-21 LAB — BASIC METABOLIC PANEL
Calcium: 8.7
Chloride: 100
Creatinine, Ser: 0.94
GFR calc Af Amer: >60
GFR calc non Af Amer: >60
Glucose, Bld: 124 — ABNORMAL HIGH
Potassium: 3.9
Sodium: 129 — ABNORMAL LOW
Sodium: 133 — ABNORMAL LOW

## 2011-02-21 LAB — COMPREHENSIVE METABOLIC PANEL
ALT: 17
Albumin: 3 — ABNORMAL LOW
BUN: 16
BUN: 21
CO2: 25
Calcium: 9.5
Chloride: 93 — ABNORMAL LOW
Creatinine, Ser: 1.03
Creatinine, Ser: 1.33
GFR calc non Af Amer: 54 — ABNORMAL LOW
Glucose, Bld: 139 — ABNORMAL HIGH
Total Bilirubin: 1
Total Protein: 5.6 — ABNORMAL LOW

## 2011-02-21 LAB — TYPE AND SCREEN: ABO/RH(D): O POS

## 2011-02-21 LAB — PROTIME-INR
INR: 0.9
Prothrombin Time: 12.1

## 2011-02-21 LAB — APTT: aPTT: 31

## 2011-02-22 LAB — COMPREHENSIVE METABOLIC PANEL
Albumin: 3.9
Alkaline Phosphatase: 81
BUN: 26 — ABNORMAL HIGH
CO2: 24
Chloride: 102
Creatinine, Ser: 1.16
GFR calc non Af Amer: >60
Potassium: 4
Total Bilirubin: 0.8

## 2011-02-22 LAB — CBC
HCT: 36.7 — ABNORMAL LOW
Hemoglobin: 12.6 — ABNORMAL LOW
MCV: 85.1
Platelets: 258
RBC: 4.31
WBC: 6.1

## 2011-02-22 LAB — URINALYSIS, ROUTINE W REFLEX MICROSCOPIC
Bilirubin Urine: NEGATIVE
Hgb urine dipstick: NEGATIVE
Ketones, ur: NEGATIVE
Specific Gravity, Urine: 1.018
Urobilinogen, UA: 1
pH: 6.5

## 2011-02-22 LAB — BLOOD GAS, ARTERIAL
Acid-Base Excess: 1.5
Bicarbonate: 25.5 — ABNORMAL HIGH
TCO2: 26.7
pCO2 arterial: 39.6
pH, Arterial: 7.425
pO2, Arterial: 74.3 — ABNORMAL LOW

## 2011-02-22 LAB — PROTIME-INR: INR: 0.9

## 2011-02-22 LAB — TYPE AND SCREEN: Antibody Screen: NEGATIVE

## 2011-02-28 ENCOUNTER — Other Ambulatory Visit: Payer: Self-pay | Admitting: Thoracic Surgery

## 2011-02-28 DIAGNOSIS — D381 Neoplasm of uncertain behavior of trachea, bronchus and lung: Secondary | ICD-10-CM

## 2011-03-05 ENCOUNTER — Ambulatory Visit: Payer: Medicare Other | Admitting: Pulmonary Disease

## 2011-03-07 ENCOUNTER — Other Ambulatory Visit: Payer: Medicare Other | Admitting: Gastroenterology

## 2011-03-20 ENCOUNTER — Ambulatory Visit (AMBULATORY_SURGERY_CENTER): Payer: Medicare Other | Admitting: Gastroenterology

## 2011-03-20 ENCOUNTER — Encounter: Payer: Self-pay | Admitting: Gastroenterology

## 2011-03-20 DIAGNOSIS — Z8500 Personal history of malignant neoplasm of unspecified digestive organ: Secondary | ICD-10-CM

## 2011-03-20 DIAGNOSIS — Z1211 Encounter for screening for malignant neoplasm of colon: Secondary | ICD-10-CM

## 2011-03-20 DIAGNOSIS — Z86010 Personal history of colonic polyps: Secondary | ICD-10-CM

## 2011-03-20 MED ORDER — SODIUM CHLORIDE 0.9 % IV SOLN: 500.0000 mL | INTRAVENOUS | Status: DC

## 2011-03-21 ENCOUNTER — Telehealth: Payer: Self-pay

## 2011-03-21 NOTE — Telephone Encounter (Signed)

## 2011-04-04 ENCOUNTER — Encounter: Payer: Self-pay | Admitting: Thoracic Surgery

## 2011-04-04 ENCOUNTER — Ambulatory Visit (INDEPENDENT_AMBULATORY_CARE_PROVIDER_SITE_OTHER): Payer: Medicare Other | Admitting: Thoracic Surgery

## 2011-04-04 ENCOUNTER — Ambulatory Visit
Admission: RE | Admit: 2011-04-04 | Discharge: 2011-04-04 | Disposition: A | Discharge status: Home / Self Care | Payer: Medicare Other | Source: Ambulatory Visit | Attending: Thoracic Surgery | Admitting: Thoracic Surgery

## 2011-04-04 VITALS — BP 132/78 | HR 68 | Resp 20 | Ht 71.0 in | Wt 238.0 lb

## 2011-04-04 DIAGNOSIS — C3490 Malignant neoplasm of unspecified part of unspecified bronchus or lung: Secondary | ICD-10-CM

## 2011-04-04 DIAGNOSIS — D381 Neoplasm of uncertain behavior of trachea, bronchus and lung: Secondary | ICD-10-CM

## 2011-04-04 NOTE — Progress Notes (Signed)
HPI CT scan shows that the right upper lobe 8 mm nodule is smaller in size this nodule was negative on PET. On the left side the area of resection with seed implantation is unchanged. We will see him back again in 6 months with a CT scan. He is over 4 years since his surgery.   Current Outpatient Prescriptions  Medication Sig Dispense Refill  . albuterol (VENTOLIN HFA) 108 (90 BASE) MCG/ACT inhaler Inhale 2 puffs into the lungs every 6 (six) hours as needed.        Marland Kitchen amiodarone (PACERONE) 200 MG tablet Take 1 tablet (200 mg total) by mouth daily.  90 tablet  1  . aspirin 81 MG tablet Take 81 mg by mouth daily.        . bisoprolol (ZEBETA) 5 MG tablet Take 1 tablet (5 mg total) by mouth daily.  30 tablet  2  . dabigatran (PRADAXA) 150 MG CAPS Take 1 capsule (150 mg total) by mouth every 12 (twelve) hours.  60 capsule  4  . FLUoxetine (PROZAC) 20 MG tablet Take 3 by mouth daily       . Fluticasone-Salmeterol (ADVAIR DISKUS) 250-50 MCG/DOSE AEPB Inhale 1 puff into the lungs every 12 (twelve) hours.        Marland Kitchen HYDROcodone-acetaminophen (NORCO) 5-325 MG per tablet Take 1 tablet by mouth every 6 (six) hours as needed.        Marland Kitchen KLOR-CON 10 10 MEQ CR tablet TAKE 1 TABLET BY MOUTH DAILY  30 tablet  6  . Multiple Vitamin (MULTIVITAMIN) tablet Take 1 tablet by mouth daily.        Marland Kitchen omeprazole (PRILOSEC) 20 MG capsule Take 20 mg by mouth daily.        Marland Kitchen testosterone cypionate (DEPOTESTOTERONE CYPIONATE) 200 MG/ML injection Inject into the muscle. 400 mg every 14 days          Review of Systems:unchanged   Physical Exam  Cardiovascular: Normal rate, regular rhythm and normal heart sounds.   Pulmonary/Chest: Effort normal and breath sounds normal.     Diagnostic Tests: CT scan shows the right upper lobe nodule is smaller in size the rest of the CT scan is stable   Impression: Status post resection non-small cell lung cancer left upper lobe was seed implantation 4 years ago   Plan: Return in 6  months with CT scan

## 2011-04-06 ENCOUNTER — Other Ambulatory Visit: Payer: Self-pay | Admitting: Cardiology

## 2011-04-06 ENCOUNTER — Ambulatory Visit (INDEPENDENT_AMBULATORY_CARE_PROVIDER_SITE_OTHER): Payer: Medicare Other | Admitting: Cardiology

## 2011-04-06 ENCOUNTER — Encounter: Payer: Self-pay | Admitting: Cardiology

## 2011-04-06 DIAGNOSIS — I2510 Atherosclerotic heart disease of native coronary artery without angina pectoris: Secondary | ICD-10-CM

## 2011-04-06 DIAGNOSIS — I5032 Chronic diastolic (congestive) heart failure: Secondary | ICD-10-CM

## 2011-04-06 DIAGNOSIS — G4733 Obstructive sleep apnea (adult) (pediatric): Secondary | ICD-10-CM

## 2011-04-06 DIAGNOSIS — I4891 Unspecified atrial fibrillation: Secondary | ICD-10-CM

## 2011-04-06 DIAGNOSIS — R0602 Shortness of breath: Secondary | ICD-10-CM

## 2011-04-06 DIAGNOSIS — R0609 Other forms of dyspnea: Secondary | ICD-10-CM

## 2011-04-06 DIAGNOSIS — J449 Chronic obstructive pulmonary disease, unspecified: Secondary | ICD-10-CM

## 2011-04-06 DIAGNOSIS — I509 Heart failure, unspecified: Secondary | ICD-10-CM

## 2011-04-06 DIAGNOSIS — J4489 Other specified chronic obstructive pulmonary disease: Secondary | ICD-10-CM

## 2011-04-06 LAB — TSH: TSH: 3.27 u[IU]/mL (ref 0.35–5.50)

## 2011-04-06 LAB — BRAIN NATRIURETIC PEPTIDE: Pro B Natriuretic peptide (BNP): 69 pg/mL (ref 0.0–100.0)

## 2011-04-06 MED ORDER — WARFARIN SODIUM 5 MG PO TABS: 5.0000 mg | ORAL_TABLET | Freq: Every day | ORAL | Status: DC

## 2011-04-06 MED ORDER — ATORVASTATIN CALCIUM 10 MG PO TABS: 10.0000 mg | ORAL_TABLET | Freq: Every day | ORAL | Status: DC

## 2011-04-06 MED ORDER — AMIODARONE HCL 100 MG PO TABS: 100.0000 mg | ORAL_TABLET | Freq: Every day | ORAL | Status: DC

## 2011-04-06 NOTE — Patient Instructions (Signed)
Decrease amiodarone to 100mg  daily.  Stop Pradaxa.  Start coumadin(warfarin) 5mg  daily or as directed.  Start atorvastatin 10mg  daily--this is for your cholesterol.  Lab today--BNP/TSH 428.31  428.32  Your physician recommends that you return for a FASTING lipid profile / liver profile in 2 months--428.31 428.32   Schedule an appointment in the coumadin clinic in about 5 days to have your coumadin checked. They can arrange follow-up for you at Dr Yehuda Budd' office.   Your physician wants you to follow-up in: 6 months with Dr Shirlee Latch. (May 2013). You will receive a reminder letter in the mail two months in advance. If you don't receive a letter, please call our office to schedule the follow-up appointment.

## 2011-04-06 NOTE — Progress Notes (Signed)
PCP: Dr. Collins Scotland  70 yo with history of paroxysmal atrial fibrillation, diastolic CHF, COPD, lung and colon cancers, moderate nonobstructive coronary disease, and HTN returns for evaluation of dyspnea and fatigue. No tachypalpitations to suggest recurrent atrial fibrillation. He is in sinus rhythm today.   Patient continues to report generalized fatigue.  This has been chronic for him.  He is wearing CPAP at night but does not think that it helps.  He still is sleepy during the day.  He is short of breath after walking about 100 yards.  This is also stable. He does not get short of breath walking up the ramp to his house. No orthopnea.  No chest pain.   He is only taking Pradaxa once a day because of expense.   Labs (1/11): K 3.6, creatinine 1.4, TSH normal, BNP 419  Labs (5/11): BNP 281=>132, K 4.2, creatinine 1.4  Labs (6/11): K 4.9, creatinine 1.4, BNP 259, maximal troponin in hospital 0.11  Labs (7/11): LDL 142, HDL 43, TSH normal, LFTs normal, creatinine 1.4, K 4.7  Labs (10/12): K 4.7, creatinine 1.45, LFTs, normal, HCT 47.8  ECG: NSR, QTc 480 msec, otherwise normal  Allergies (verified):  No Known Drug Allergies   Past Medical History:  1. 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 COLON CANCER (ICD-153.9): s/p resection in 2008.  3. OSTEOARTHRITIS (ICD-715.90)  - s/p Bilat. Hip and Knee replacements with redo R Knee replacement and redo left TKR.  4. HYPERTENSION (ICD-401.9)  5. ATRIAL FIBRILLATION, HX OF (ICD-V12.59): post-operative in 2008. Had another episode in 6/11 in the setting of PNA. He was started on dabigatran and on amiodarone to maintain NSR.  6. CAD: Adenosine myoview (6/08): EF 56%, fixed inferior defect may have been attenuation. No evidence for ischemia. Lexiscan myoview (5/11): EF 45%, lateral hypokinesis, apical thinning with no evidence for ischemia or infarction. Left heart cath (7/11) with  EF 50%, mild global hypokinesis, 50% mid RCA, 40% PLV, 50% ostial ramus. Moderate nonobstructive disease, medical management.  7. COPD: moderate  - PFT's 04/04/09: FEV1 33%, FEV1% 54  - PFTs 07/11: FEV1 50%, FEV1% 54, TLC 80%, DLCO 69%  - PFTs (5/12): moderate obstruction by spirometry, normal lung volume, normal DLCO 8. CKD  9. ? Dementia (he is on Aricept)  10. CHF: Diastolic, echo (8/08) with EF 44%, moderate LVH. Echo (5/11): EF 55%, mild LV hypertrophy, normal wall motion, mild diastolic dysfunction, mild MR, mild LAE, normal RV size and systolic function. RHC (7/11): mean RA 7, PA 29/15, PCWP 8.  11. RIght upper lobe PNA 6/11  12. OSA on CPAP  Family History:  Colon CA- Sister and Father  Family History Asthma---sister  Family History Diabetes---2 sisters   Social History:  Married, lives in [place]  Children  Former smoker. Quit in 2008. Smoked approx 40 yrs up to 3 ppd.  Quit ETOH 2008  Working at [employer]   Review of Systems  All systems reviewed and negative except as per HPI.   Current Outpatient Prescriptions  Medication Sig Dispense Refill  . albuterol (VENTOLIN HFA) 108 (90 BASE) MCG/ACT inhaler Inhale 2 puffs into the lungs every 6 (six) hours as needed.        Marland Kitchen aspirin 81 MG tablet Take 81 mg by mouth daily.        . bisoprolol (ZEBETA) 5 MG tablet Take 1 tablet (5 mg total) by mouth  daily.  30 tablet  2  . FLUoxetine (PROZAC) 20 MG tablet Take 3 by mouth daily       . Fluticasone-Salmeterol (ADVAIR DISKUS) 250-50 MCG/DOSE AEPB Inhale 1 puff into the lungs every 12 (twelve) hours.        Marland Kitchen HYDROcodone-acetaminophen (NORCO) 5-325 MG per tablet Take 1 tablet by mouth every 6 (six) hours as needed.        Marland Kitchen KLOR-CON 10 10 MEQ CR tablet TAKE 1 TABLET BY MOUTH DAILY  30 tablet  6  . Multiple Vitamin (MULTIVITAMIN) tablet Take 1 tablet by mouth daily.        Marland Kitchen testosterone cypionate (DEPOTESTOTERONE CYPIONATE) 200 MG/ML injection Inject into the muscle. 400 mg  every 14 days       . amiodarone (PACERONE) 100 MG tablet Take 1 tablet (100 mg total) by mouth daily.  30 tablet  6  . atorvastatin (LIPITOR) 10 MG tablet Take 1 tablet (10 mg total) by mouth daily.  30 tablet  3  . warfarin (COUMADIN) 5 MG tablet Take 1 tablet (5 mg total) by mouth daily.  30 tablet  0    BP 140/68  Pulse 69  Ht 6' (1.829 m)  Wt 107.502 kg (237 lb)  BMI 32.14 kg/m2 General: NAD, overweight Neck: No JVD, no thyromegaly or thyroid nodule.  Lungs: Clear to auscultation bilaterally with normal respiratory effort. CV: Nondisplaced PMI.  Heart regular S1/S2, no S3/S4, no murmur.  No peripheral edema.  No carotid bruit.  Normal pedal pulses.  Abdomen: Soft, nontender, no hepatosplenomegaly, no distention.  Skin: Intact without lesions or rashes.  Neurologic: Alert and oriented x 3.  Psych: Normal affect. Extremities: No clubbing or cyanosis.  HEENT: Normal.

## 2011-04-08 NOTE — Assessment & Plan Note (Signed)
On CPAP but does not feel like it is helping.  He will go back to Dr. Craige Cotta about this.

## 2011-04-08 NOTE — Assessment & Plan Note (Signed)
He does not appear volume overloaded on exam.  He is taking Lasix 20 mg daily, which I will have him continue.  Check BNP today.

## 2011-04-08 NOTE — Assessment & Plan Note (Addendum)
No evidence for recurrent atrial fibrillation.  He is not able to afford Pradaxa.  I will have him go on coumadin instead.  He will get INR checks at Dr. Alda Berthold office (we will help arrange).  He will continue amiodarone to maintain NSR.  PFTs this year were stable, LFTs have been normal.  Check TSH today.  Will need yearly eye exams.  I will decrease amiodarone to 100 mg daily (maintain on minimal dose).

## 2011-04-08 NOTE — Assessment & Plan Note (Signed)
Continue inhalers, moderate COPD on PFTs.

## 2011-04-08 NOTE — Assessment & Plan Note (Signed)
I suspect that the major cause of his dyspnea and fatigue at this point is COPD.  Diastolic CHF seems well-compensated.  I will ask him to go back to Dr. Craige Cotta as he does not feel like his CPAP is working well.

## 2011-04-08 NOTE — Assessment & Plan Note (Signed)
Nonobstructive CAD.  Continue ASA 81, bisoprolol.  He needs to restart statin: use atorvastatin 10 mg daily with lipids/LFTs in 2 months.

## 2011-04-11 ENCOUNTER — Encounter: Payer: Medicare Other | Admitting: *Deleted

## 2011-05-28 ENCOUNTER — Ambulatory Visit (INDEPENDENT_AMBULATORY_CARE_PROVIDER_SITE_OTHER): Payer: Medicare Other | Admitting: Surgery

## 2011-06-04 ENCOUNTER — Other Ambulatory Visit (INDEPENDENT_AMBULATORY_CARE_PROVIDER_SITE_OTHER): Payer: Medicare Other | Admitting: *Deleted

## 2011-06-04 DIAGNOSIS — I509 Heart failure, unspecified: Secondary | ICD-10-CM

## 2011-06-04 DIAGNOSIS — I5032 Chronic diastolic (congestive) heart failure: Secondary | ICD-10-CM

## 2011-06-04 DIAGNOSIS — I4891 Unspecified atrial fibrillation: Secondary | ICD-10-CM

## 2011-06-04 LAB — LIPID PANEL
Cholesterol: 135 mg/dL (ref 0–200)
HDL: 46.6 mg/dL (ref >39.00)
VLDL: 12.2 mg/dL (ref 0.0–40.0)

## 2011-06-04 LAB — HEPATIC FUNCTION PANEL
Albumin: 4.2 g/dL (ref 3.5–5.2)
Total Bilirubin: 0.5 mg/dL (ref 0.3–1.2)

## 2011-06-05 ENCOUNTER — Telehealth: Payer: Self-pay | Admitting: *Deleted

## 2011-06-05 NOTE — Telephone Encounter (Signed)
With CKD (last creatinine 1.45), would use rivaroxaban 15 mg daily rather than Pradaxa.  May stop coumadin and start rivaroxaban when INR < 3.  Only need to take once a day.

## 2011-06-05 NOTE — Telephone Encounter (Signed)
Discussed with pt. Pt will have pro-time checked in the next day or so. At Dr Alda Berthold office.  He will let me know the INR and I will sent in prescription for rivaroxaban 15mg  daily.

## 2011-06-05 NOTE — Telephone Encounter (Signed)
[place], [place], [place] - patient wants to change from coumadin to padaxa More Detail >>      patient wants to change from coumadin to padaxa      Connye Burkitt, NT        Sent: Mon June 04, 2011  8:55 AM    To: Jacqlyn Krauss, RN   06/05/11--I talked with pt. Pt wants to change from coumadin to Pradaxa now that he is out of the doughnut hole. He has been getting his pro-times at Dr Alda Berthold office and had one about 2 weeks ago. I will forward to Dr Shirlee Latch for review and recommendations.      Jonathan Cruise Sr.    MRN: [id number] DOB: 07/28/1940     Pt Home: [phone]               Message     [name] does not like coumadin and he would like for Dr. Shirlee Latch to change him aback to paradax ASAP. [facility].   Patient phone number [phone]

## 2011-06-07 ENCOUNTER — Encounter (INDEPENDENT_AMBULATORY_CARE_PROVIDER_SITE_OTHER): Payer: Self-pay | Admitting: Surgery

## 2011-06-07 ENCOUNTER — Ambulatory Visit (INDEPENDENT_AMBULATORY_CARE_PROVIDER_SITE_OTHER): Payer: Medicare Other | Admitting: Surgery

## 2011-06-07 VITALS — BP 146/68 | HR 77 | Temp 98.3°F | Ht 72.0 in | Wt 243.0 lb

## 2011-06-07 DIAGNOSIS — R222 Localized swelling, mass and lump, trunk: Secondary | ICD-10-CM | POA: Insufficient documentation

## 2011-06-07 NOTE — Patient Instructions (Signed)
Lipoma A lipoma is a noncancerous (benign) tumor composed of fat cells. They are usually found under the skin (subcutaneous). A lipoma may occur in any tissue of the body that contains fat. Common areas for lipomas to appear include the back, shoulders, buttocks, and thighs. Lipomas are a very common soft tissue growth. They are soft and grow slowly. Most problems caused by a lipoma depend on where it is growing. DIAGNOSIS  A lipoma can be diagnosed with a physical exam. These tumors rarely become cancerous, but radiographic studies can help determine this for certain. Studies used may include:  Computerized X-ray scans (CT or CAT scan).   Computerized magnetic scans (MRI).  TREATMENT  Small lipomas that are not causing problems may be watched. If a lipoma continues to enlarge or causes problems, removal is often the best treatment. Lipomas can also be removed to improve appearance. Surgery is done to remove the fatty cells and the surrounding capsule. Most often, this is done with medicine that numbs the area (local anesthetic). The removed tissue is examined under a microscope to make sure it is not cancerous. Keep all follow-up appointments with your caregiver. SEEK MEDICAL CARE IF:   The lipoma becomes larger or hard.   The lipoma becomes painful, red, or increasingly swollen. These could be signs of infection or a more serious condition.  Document Released: 04/20/2002 Document Revised: 01/10/2011 Document Reviewed: 09/30/2009 [facility] Patient Information 2012 [place], [place].  Epidermal Cyst An epidermal cyst is usually a small, painless lump under the skin. Cysts often occur on the face, neck, stomach, chest, or genitals. The cyst may be filled with a bad smelling paste. Do not pop your cyst. Popping the cyst can cause pain and puffiness (swelling). HOME CARE   Only take medicines as told by your doctor.   Take your medicine (antibiotics) as told. Finish it even if you start to feel  better.  GET HELP RIGHT AWAY IF:  Your cyst is tender, red, or puffy.   You are not getting better, or you are getting worse.   You have any questions or concerns.  MAKE SURE YOU:  Understand these instructions.   Will watch your condition.   Will get help right away if you are not doing well or get worse.  Document Released: 06/07/2004 Document Revised: 01/10/2011 Document Reviewed: 11/06/2010 [facility] Patient Information 2012 [place], [place].

## 2011-06-07 NOTE — Progress Notes (Signed)
Patient ID: Jonathan Saxon., male   DOB: May 12, 1941, 71 y.o.   MRN: [id number]  Chief Complaint  Patient presents with  . Pre-op Exam    eval lump on chest    HPI Jonathan Proehl. is a 71 y.o. male.   HPI The patient is sent at the request of Dr. Collins Scotland due to 2 masses overlying the sternum. They have been present for many years. They have increased in size slowly. They are not causing pain. There is no redness or drainage.  Past Medical History  Diagnosis Date  . Adenocarcinoma, lung 2008  . Adenocarcinoma of sigmoid colon 2008  . COPD (chronic obstructive pulmonary disease)     PFTs 07/11>>FEV1 50%, FEV1% 54, TLC 80%, DLCO 69%  . OSA (obstructive sleep apnea)     PSG 06/29/10>>AHI 61.4  . Coronary artery disease   . Hypertension   . Diastolic congestive heart failure   . Atrial fibrillation     Chronic amiodarone therapy  . Osteoarthritis   . Chronic kidney disease   . Pneumonia June 2011  . Depression   . Low testosterone   . Emphysema of lung   . Lung cancer   . Basal cell cancer   . CHF (congestive heart failure)   . Visual disturbance   . Hearing loss     Past Surgical History  Procedure Date  . Left upper lobe wedge resection 2008  . Total hip arthroplasty     Bilateral  . Replacement total knee bilateral   . Low anterior bowel resection 2008  . Cataract extraction w/ intraocular lens  implant, bilateral   . Colonoscopy   . Colon surgery   . Skin caner   . Lung cancer surgery     Family History  Problem Relation Age of Onset  . Colon cancer Father   . Cancer Father     colon  . Colon cancer Sister   . Cancer Sister     colon  . Asthma Sister   . Diabetes Sister     Social History History  Substance Use Topics  . Smoking status: Former Smoker -- 35 years    Types: Cigarettes    Quit date: 05/14/2006  . Smokeless tobacco: Never Used   Comment: 1-3 packs a day  . Alcohol Use: No     Quit 2008    No Known Allergies  Current Outpatient  Prescriptions  Medication Sig Dispense Refill  . albuterol (VENTOLIN HFA) 108 (90 BASE) MCG/ACT inhaler Inhale 2 puffs into the lungs every 6 (six) hours as needed.        Marland Kitchen amiodarone (PACERONE) 100 MG tablet Take 1 tablet (100 mg total) by mouth daily.  30 tablet  6  . aspirin 81 MG tablet Take 81 mg by mouth daily.        Marland Kitchen atorvastatin (LIPITOR) 10 MG tablet Take 1 tablet (10 mg total) by mouth daily.  30 tablet  3  . bisoprolol (ZEBETA) 5 MG tablet Take 1 tablet (5 mg total) by mouth daily.  30 tablet  2  . FLUoxetine (PROZAC) 20 MG tablet Take 3 by mouth daily       . Fluticasone-Salmeterol (ADVAIR DISKUS) 250-50 MCG/DOSE AEPB Inhale 1 puff into the lungs every 12 (twelve) hours.        Marland Kitchen HYDROcodone-acetaminophen (NORCO) 5-325 MG per tablet Take 1 tablet by mouth every 6 (six) hours as needed.        Marland Kitchen  KLOR-CON 10 10 MEQ CR tablet TAKE 1 TABLET BY MOUTH DAILY  30 tablet  6  . Multiple Vitamin (MULTIVITAMIN) tablet Take 1 tablet by mouth daily.        Marland Kitchen testosterone cypionate (DEPOTESTOTERONE CYPIONATE) 200 MG/ML injection Inject into the muscle. 400 mg every 14 days       . warfarin (COUMADIN) 5 MG tablet Take 1 tablet (5 mg total) by mouth daily.  30 tablet  0    Review of Systems Review of Systems  Constitutional: Positive for fatigue.  HENT: Negative.   Eyes: Negative.   Respiratory: Negative.   Cardiovascular: Negative.   Gastrointestinal: Negative.   Genitourinary: Negative.   Musculoskeletal: Negative.   Neurological: Negative.   Hematological: Negative.   Psychiatric/Behavioral: Negative.     Blood pressure 146/68, pulse 77, temperature 98.3 F (36.8 C), temperature source Temporal, height 6' (1.829 m), weight 243 lb (110.224 kg), SpO2 95.00%.  Physical Exam Physical Exam  Constitutional: He is oriented to person, place, and time. He appears well-developed and well-nourished.  HENT:  Head: Normocephalic and atraumatic.  Eyes: EOM are normal. Pupils are equal,  round, and reactive to light.  Neck: Normal range of motion. Neck supple.  Cardiovascular: Normal rate and normal heart sounds.        Afib  Pulmonary/Chest: Effort normal and breath sounds normal.  Abdominal: Soft. Bowel sounds are normal.  Neurological: He is oriented to person, place, and time. He has normal strength. GCS eye subscore is 4. GCS verbal subscore is 5. GCS motor subscore is 6.  Skin:       Data Reviewed Cardiology notes  Assessment    Epidermal inclusion cyst and lipoma chect wall asymptomatic   Plan    Pt does not wish surgery at this time. Information given about lipoma and cyst.       Asiah Browder A. 06/07/2011, 9:47 AM

## 2011-07-10 IMAGING — CT CT CHEST W/O CM
2 of 3 series · 13 of 30 positions shown, 15 images · non-contrast
Comparison: CT thorax 10/13/2008

CLINICAL DATA: Follow-up lung lesion

CT CHEST WITHOUT CONTRAST
TECHNIQUE: Multidetector CT imaging of the chest was performed
following the standard protocol without IV contrast.

[Series 3: routine chest · axial · 0.88mm/px · z∈[-256,-41]mm · 5 of 65 slices shown, 7 images]
[im 11/65  mediastinal]
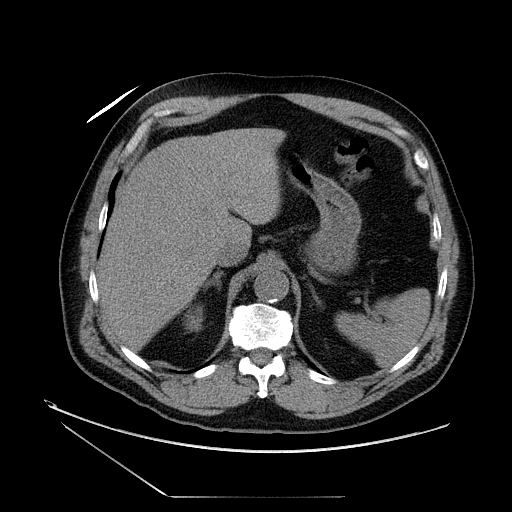
[im 11/65  lung]
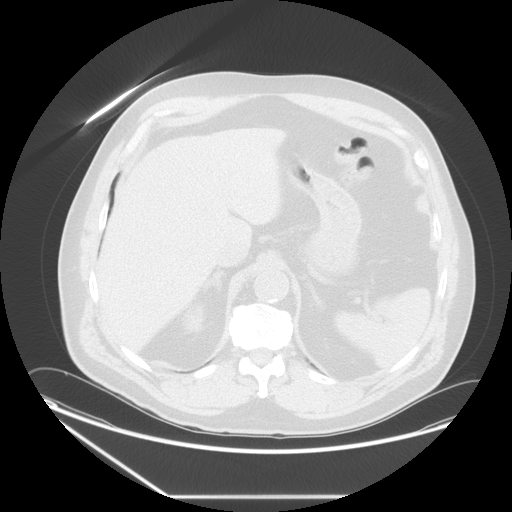
[im 22/65  lung]
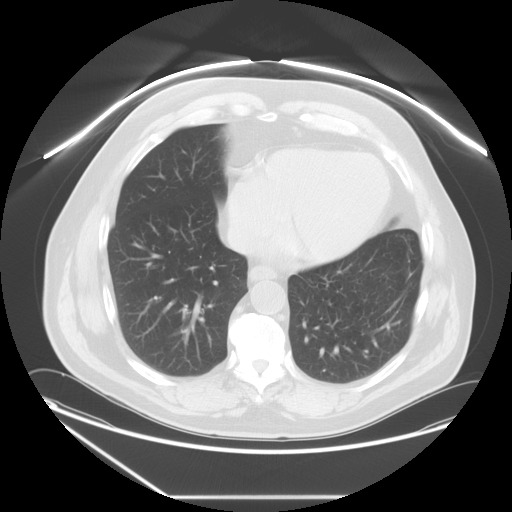
[im 33/65  lung]
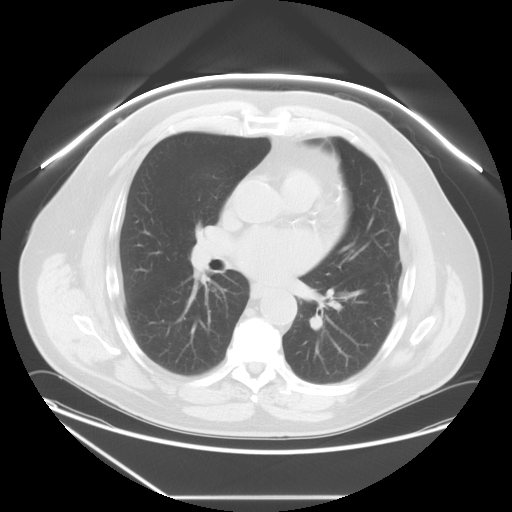
[im 43/65  lung]
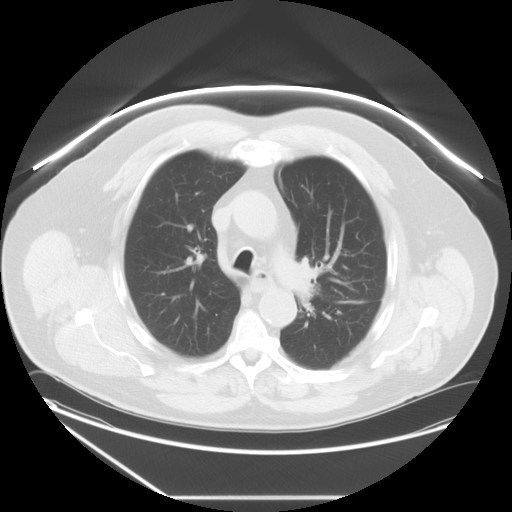
[im 54/65  mediastinal]
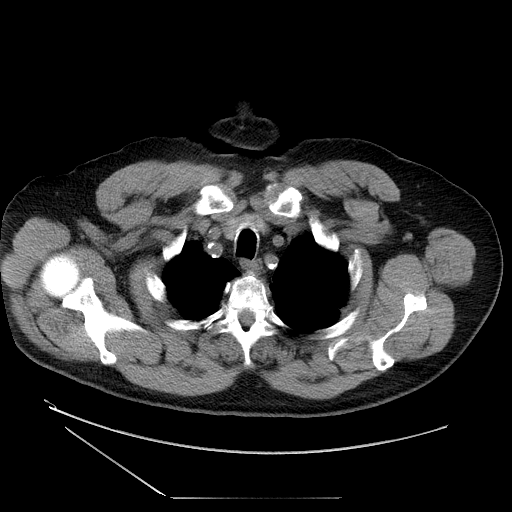
[im 54/65  lung]
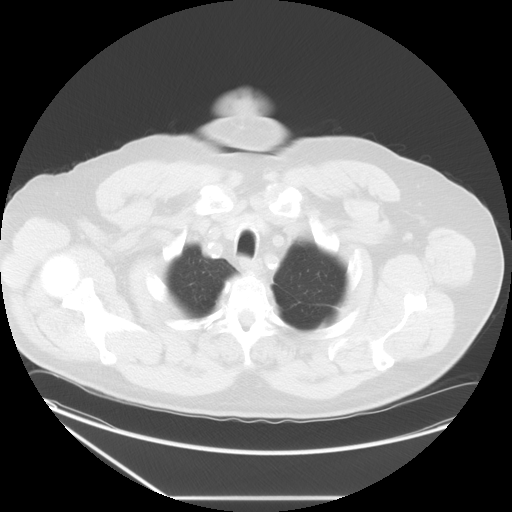

[Series 602: sagittal body · sagittal · 0.88mm/px · 8 of 181 slices shown]
[im 19/181  mediastinal]
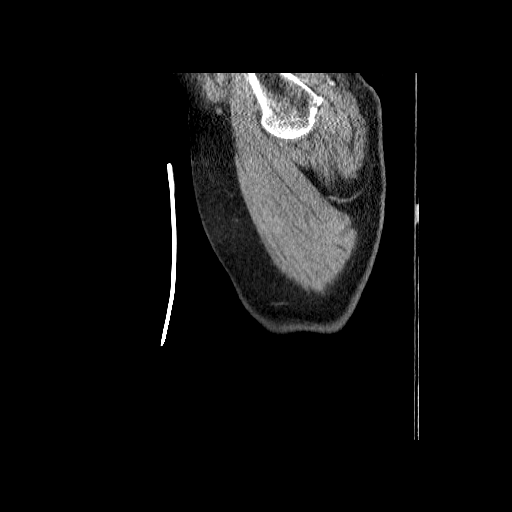
[im 37/181  mediastinal]
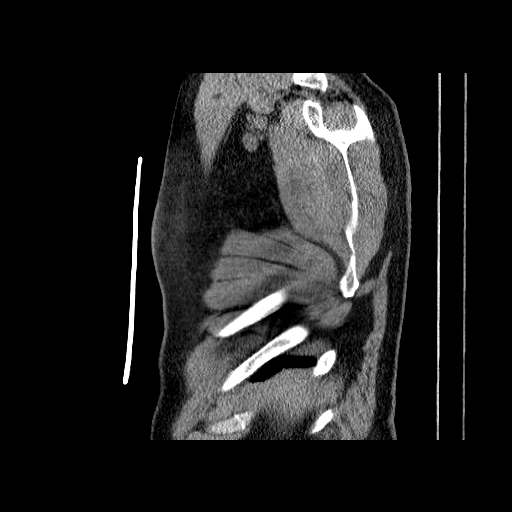
[im 55/181  mediastinal]
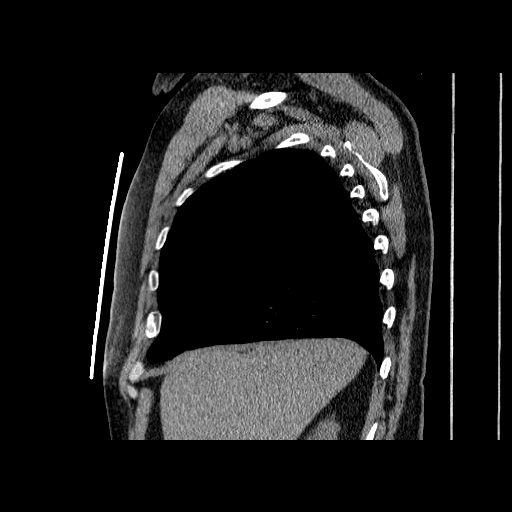
[im 82/181  mediastinal]
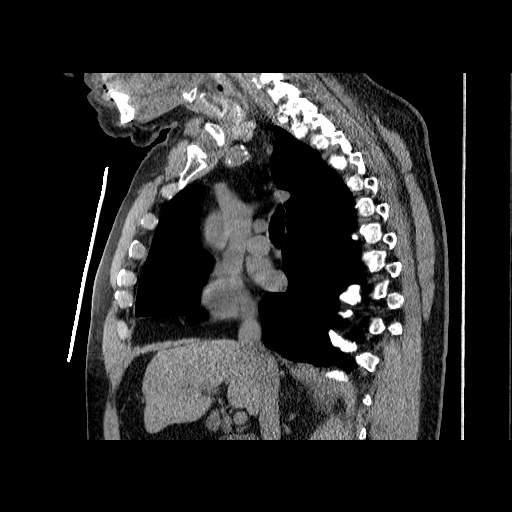
[im 100/181  mediastinal]
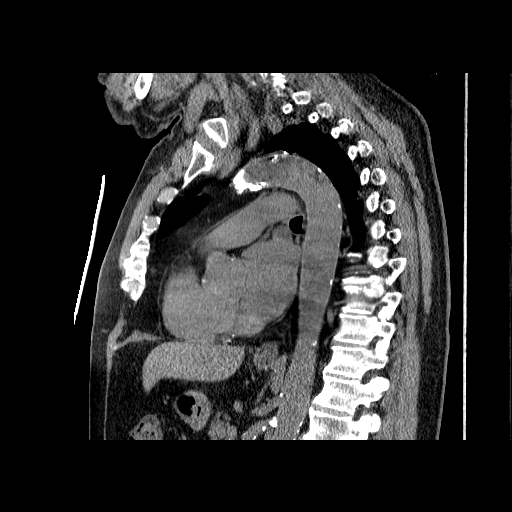
[im 127/181  mediastinal]
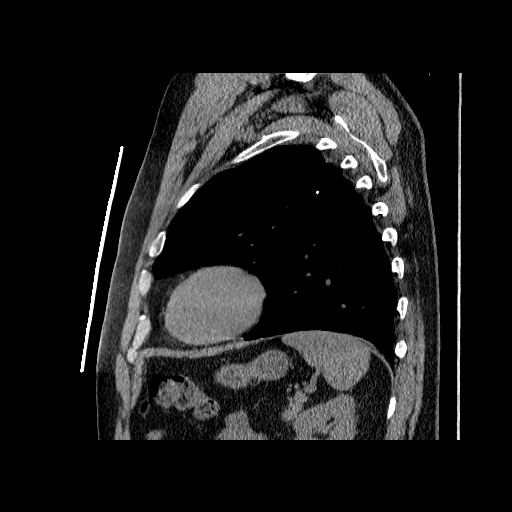
[im 145/181  mediastinal]
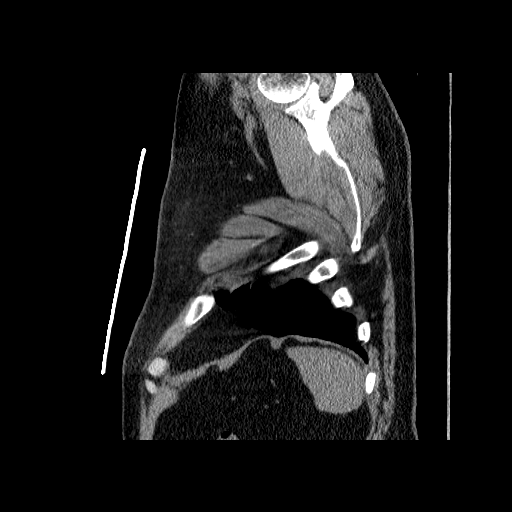
[im 163/181  mediastinal]
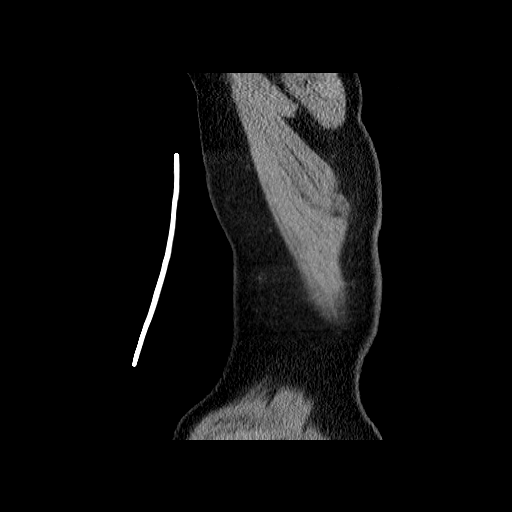

[13 of 30 positions shown; findings below may reference images not displayed]

FINDINGS: No chest wall abnormality.  No evidence of axillary or
supraclavicular lymphadenopathy.  No evidence of mediastinal or
hilar lymphadenopathy.  No pericardial effusion.

Postsurgical change in the left upper lobe with brachytherapy seeds
appears stable.  There is thickening along the oblique fissure to 8
mm (image #20) which is again stable.  No new pulmonary nodules.
Central airways appear normal.

Limited view of the upper abdomen demonstrates normal adrenal
glands.

Limited view of the skeleton to shows no aggressive osseous
lesions.  There is bulky osteophytosis of the thoracic spine.
IMPRESSION: Stable postsurgical and post therapy findings within the left upper
lobe.

No evidence of metastasis.

## 2011-07-16 ENCOUNTER — Other Ambulatory Visit: Payer: Self-pay | Admitting: *Deleted

## 2011-07-16 DIAGNOSIS — I4891 Unspecified atrial fibrillation: Secondary | ICD-10-CM

## 2011-07-16 DIAGNOSIS — I5032 Chronic diastolic (congestive) heart failure: Secondary | ICD-10-CM

## 2011-07-16 MED ORDER — ATORVASTATIN CALCIUM 10 MG PO TABS: 10.0000 mg | ORAL_TABLET | Freq: Every day | ORAL | Status: DC

## 2011-07-30 ENCOUNTER — Other Ambulatory Visit: Payer: Self-pay | Admitting: *Deleted

## 2011-07-30 MED ORDER — BISOPROLOL FUMARATE 5 MG PO TABS: 5.0000 mg | ORAL_TABLET | Freq: Every day | ORAL | Status: DC

## 2011-08-21 ENCOUNTER — Other Ambulatory Visit: Payer: Self-pay | Admitting: Thoracic Surgery

## 2011-08-21 DIAGNOSIS — D381 Neoplasm of uncertain behavior of trachea, bronchus and lung: Secondary | ICD-10-CM

## 2011-09-19 ENCOUNTER — Encounter: Payer: Self-pay | Admitting: Thoracic Surgery

## 2011-09-19 ENCOUNTER — Ambulatory Visit: Payer: Medicare Other | Admitting: Thoracic Surgery

## 2011-09-19 ENCOUNTER — Ambulatory Visit
Admission: RE | Admit: 2011-09-19 | Discharge: 2011-09-19 | Disposition: A | Discharge status: Home / Self Care | Payer: Medicare Other | Source: Ambulatory Visit | Attending: Thoracic Surgery | Admitting: Thoracic Surgery

## 2011-09-19 ENCOUNTER — Ambulatory Visit (INDEPENDENT_AMBULATORY_CARE_PROVIDER_SITE_OTHER): Payer: Medicare Other | Admitting: Thoracic Surgery

## 2011-09-19 VITALS — BP 124/73 | HR 89 | Resp 18 | Ht 71.0 in | Wt 238.0 lb

## 2011-09-19 DIAGNOSIS — Z09 Encounter for follow-up examination after completed treatment for conditions other than malignant neoplasm: Secondary | ICD-10-CM

## 2011-09-19 DIAGNOSIS — D381 Neoplasm of uncertain behavior of trachea, bronchus and lung: Secondary | ICD-10-CM

## 2011-09-19 DIAGNOSIS — C3490 Malignant neoplasm of unspecified part of unspecified bronchus or lung: Secondary | ICD-10-CM

## 2011-09-19 NOTE — Progress Notes (Signed)
HPI patient returns for followup. He is now 5 years since resection as he had a left upper lobe all wedge resection for stage IA adenocarcinoma. He has a previous history of colonic carcinoma. CT scan today showed no evidence of recurrence. He is doing well overall. We will have him follow up by his primary care physician.   Current Outpatient Prescriptions  Medication Sig Dispense Refill  . albuterol (VENTOLIN HFA) 108 (90 BASE) MCG/ACT inhaler Inhale 2 puffs into the lungs every 6 (six) hours as needed.        Marland Kitchen amiodarone (PACERONE) 100 MG tablet Take 1 tablet (100 mg total) by mouth daily.  30 tablet  6  . aspirin 81 MG tablet Take 81 mg by mouth daily.        Marland Kitchen atorvastatin (LIPITOR) 10 MG tablet Take 1 tablet (10 mg total) by mouth daily.  30 tablet  3  . bisoprolol (ZEBETA) 5 MG tablet Take 1 tablet (5 mg total) by mouth daily.  30 tablet  2  . FLUoxetine (PROZAC) 20 MG tablet Take 3 by mouth daily       . Fluticasone-Salmeterol (ADVAIR DISKUS) 250-50 MCG/DOSE AEPB Inhale 1 puff into the lungs every 12 (twelve) hours.        Marland Kitchen HYDROcodone-acetaminophen (NORCO) 5-325 MG per tablet Take 1 tablet by mouth every 6 (six) hours as needed.        Marland Kitchen KLOR-CON 10 10 MEQ CR tablet TAKE 1 TABLET BY MOUTH DAILY  30 tablet  6  . Multiple Vitamin (MULTIVITAMIN) tablet Take 1 tablet by mouth daily.        Marland Kitchen testosterone cypionate (DEPOTESTOTERONE CYPIONATE) 200 MG/ML injection Inject into the muscle. 400 mg every 14 days          Review of Systems: Unchanged   Physical Exam lungs are clear  auscultation percussion   Diagnostic Tests CT scan showed no evidence of recurrence of his lung:   Impression status post resection of the left upper lobe stage IA adenocarcinoma Plan: Return as needed.

## 2011-10-15 ENCOUNTER — Ambulatory Visit: Payer: Self-pay | Admitting: Cardiology

## 2011-10-22 ENCOUNTER — Other Ambulatory Visit: Payer: Self-pay | Admitting: Cardiology

## 2011-11-26 ENCOUNTER — Other Ambulatory Visit: Payer: Self-pay | Admitting: *Deleted

## 2011-11-26 MED ORDER — BISOPROLOL FUMARATE 5 MG PO TABS: 5.0000 mg | ORAL_TABLET | Freq: Every day | ORAL | Status: DC

## 2011-11-26 NOTE — Telephone Encounter (Signed)
Refilled Bisoprolol.

## 2011-12-05 ENCOUNTER — Encounter: Payer: Self-pay | Admitting: Cardiology

## 2011-12-05 ENCOUNTER — Ambulatory Visit (INDEPENDENT_AMBULATORY_CARE_PROVIDER_SITE_OTHER): Payer: Medicare Other | Admitting: Cardiology

## 2011-12-05 ENCOUNTER — Telehealth: Payer: Self-pay | Admitting: Cardiology

## 2011-12-05 VITALS — BP 138/98 | HR 55 | Ht 71.0 in | Wt 239.0 lb

## 2011-12-05 DIAGNOSIS — I5032 Chronic diastolic (congestive) heart failure: Secondary | ICD-10-CM

## 2011-12-05 DIAGNOSIS — J449 Chronic obstructive pulmonary disease, unspecified: Secondary | ICD-10-CM

## 2011-12-05 DIAGNOSIS — I2510 Atherosclerotic heart disease of native coronary artery without angina pectoris: Secondary | ICD-10-CM

## 2011-12-05 DIAGNOSIS — I4891 Unspecified atrial fibrillation: Secondary | ICD-10-CM

## 2011-12-05 DIAGNOSIS — J4489 Other specified chronic obstructive pulmonary disease: Secondary | ICD-10-CM

## 2011-12-05 MED ORDER — FUROSEMIDE 40 MG PO TABS: ORAL_TABLET | ORAL | Status: DC

## 2011-12-05 MED ORDER — FUROSEMIDE 40 MG PO TABS: 40.0000 mg | ORAL_TABLET | Freq: Every day | ORAL | Status: DC

## 2011-12-05 MED ORDER — RIVAROXABAN 15 MG PO TABS: 15.0000 mg | ORAL_TABLET | Freq: Every day | ORAL | Status: DC

## 2011-12-05 MED ORDER — AMIODARONE HCL 100 MG PO TABS: 100.0000 mg | ORAL_TABLET | Freq: Every day | ORAL | Status: DC

## 2011-12-05 NOTE — Patient Instructions (Addendum)
Stop aspirin.  Start Xarelto 15mg  daily. This is a blood thinner.   Take lasix(furosemide) to 40mg  daily.  Your physician has recommended that you have a pulmonary function test. Pulmonary Function Tests are a group of tests that measure how well air moves in and out of your lungs. In the next week or so.  Your physician recommends that you return for a FASTING lipid profile /liver profile/BMET/BNP/TSH/CBC.---In 1 week.  Your physician recommends that you schedule a follow-up appointment in: 2 months with Dr Shirlee Latch.

## 2011-12-05 NOTE — Progress Notes (Signed)
PCP: Dr. Collins Scotland  71 yo with history of paroxysmal atrial fibrillation, diastolic CHF, COPD, lung and colon cancers, moderate nonobstructive coronary disease, and HTN returns for evaluation of dyspnea and fatigue. No tachypalpitations to suggest recurrent atrial fibrillation. He is in sinus rhythm today.  He has not been on anticoagulation.  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 However, he ran out of refills and is not on it currently.  He is a little vague when trying to explain what happened to his anticoagulation.  He does deny any side effects like bleeding.    Patient continues to report generalized fatigue.  This has been chronic for him.  He is wearing CPAP at night.  He is short of breath after walking about 100 feet.  This is a bit worse than in the past.  No chest pain.  No orthopnea or PND.  He take Lasix 40 mg daily. Weight is up 2 lbs since last appointment.   Labs (1/11): K 3.6, creatinine 1.4, TSH normal, BNP 419  Labs (5/11): BNP 281=>132, K 4.2, creatinine 1.4  Labs (6/11): K 4.9, creatinine 1.4, BNP 259, maximal troponin in hospital 0.11  Labs (7/11): LDL 142, HDL 43, TSH normal, LFTs normal, creatinine 1.4, K 4.7  Labs (10/12): K 4.7, creatinine 1.45, LFTs, normal, HCT 47.8 Labs (1/13): LFTs normal, LDL 76, HDL 47  ECG: NSR, QTc 482 msec, otherwise normal  Allergies (verified):  No Known Drug Allergies   Past Medical History:  1. 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 COLON CANCER (ICD-153.9): s/p resection in 2008.  3. OSTEOARTHRITIS (ICD-715.90)  - s/p Bilat. Hip and Knee replacements with redo R Knee replacement and redo left TKR.  4. HYPERTENSION (ICD-401.9)  5. ATRIAL FIBRILLATION, HX OF (ICD-V12.59): post-operative in 2008. Had another episode in 6/11 in the setting of PNA. He was started on dabigatran and on amiodarone to  maintain NSR.  6. CAD: Adenosine myoview (6/08): EF 56%, fixed inferior defect may have been attenuation. No evidence for ischemia. Lexiscan myoview (5/11): EF 45%, lateral hypokinesis, apical thinning with no evidence for ischemia or infarction. Left heart cath (7/11) with EF 50%, mild global hypokinesis, 50% mid RCA, 40% PLV, 50% ostial ramus. Moderate nonobstructive disease, medical management.  7. COPD: moderate  - PFT's 04/04/09: FEV1 33%, FEV1% 54  - PFTs 07/11: FEV1 50%, FEV1% 54, TLC 80%, DLCO 69%  - PFTs (5/12): moderate obstruction by spirometry, normal lung volume, normal DLCO 8. CKD  9. ? Dementia (was on Aricept at one point)  10. CHF: Diastolic, echo (8/08) with EF 16%, moderate LVH. Echo (5/11): EF 55%, mild LV hypertrophy, normal wall motion, mild diastolic dysfunction, mild MR, mild LAE, normal RV size and systolic function. RHC (7/11): mean RA 7, PA 29/15, PCWP 8.  11. RIght upper lobe PNA 6/11  12. OSA on CPAP  Family History:  Colon CA- Sister and Father  Family History Asthma---sister  Family History Diabetes---2 sisters   Social History:  Married, lives in [place]  Children  Former smoker. Quit in 2008. Smoked approx 40 yrs up to 3 ppd.  Quit ETOH 2008  Working at [employer]   Review of Systems  All systems reviewed and negative except as per HPI.   Current Outpatient Prescriptions  Medication Sig Dispense Refill  . albuterol (VENTOLIN  HFA) 108 (90 BASE) MCG/ACT inhaler Inhale 2 puffs into the lungs every 6 (six) hours as needed.        Marland Kitchen amiodarone (PACERONE) 100 MG tablet Take 1 tablet (100 mg total) by mouth daily.  30 tablet  6  . atorvastatin (LIPITOR) 10 MG tablet Take 1 tablet (10 mg total) by mouth daily.  30 tablet  3  . bisoprolol (ZEBETA) 5 MG tablet Take 1 tablet (5 mg total) by mouth daily.  30 tablet  2  . FLUoxetine (PROZAC) 20 MG tablet Take 3 by mouth daily       . Fluticasone-Salmeterol (ADVAIR DISKUS) 250-50 MCG/DOSE AEPB Inhale 1 puff  into the lungs every 12 (twelve) hours.        Marland Kitchen HYDROcodone-acetaminophen (NORCO) 5-325 MG per tablet Take 1 tablet by mouth every 6 (six) hours as needed.        Marland Kitchen KLOR-CON 10 10 MEQ tablet TAKE 1 TABLET BY MOUTH DAILY  30 tablet  6  . Multiple Vitamin (MULTIVITAMIN) tablet Take 1 tablet by mouth daily.        Marland Kitchen testosterone cypionate (DEPOTESTOTERONE CYPIONATE) 200 MG/ML injection Inject into the muscle. 400 mg every 14 days       . DISCONTD: amiodarone (PACERONE) 100 MG tablet Take 1 tablet (100 mg total) by mouth daily.  30 tablet  6  . furosemide (LASIX) 40 MG tablet Take 1 in the morning and 1/2 in the afternoon  45 tablet  6  . Rivaroxaban (XARELTO) 15 MG TABS tablet Take 1 tablet (15 mg total) by mouth daily.  30 tablet  3  . DISCONTD: furosemide (LASIX) 40 MG tablet Take 1 tablet (40 mg total) by mouth daily.  30 tablet  6    BP 138/98  Pulse 55  Ht 5\' 11"  (1.803 m)  Wt 239 lb (108.41 kg)  BMI 33.33 kg/m2 General: NAD, overweight Neck: JVP difficult, no thyromegaly or thyroid nodule.  Lungs: Clear to auscultation bilaterally with normal respiratory effort. CV: Nondisplaced PMI.  Heart regular S1/S2, no S3/S4, no murmur.  No peripheral edema.  No carotid bruit.  Normal pedal pulses.  Abdomen: Soft, nontender, no hepatosplenomegaly, no distention.  Skin: Intact without lesions or rashes.  Neurologic: Alert and oriented x 3.  Psych: Normal affect. Extremities: No clubbing or cyanosis.  HEENT: Normal.

## 2011-12-05 NOTE — Telephone Encounter (Signed)
Reviewed with Dr Shirlee Latch. He recommended pt increase lasix to 40mg  in the morning and 20mg  in the afternoon. Pt is aware of Dr Alford Highland recommendations.

## 2011-12-05 NOTE — Telephone Encounter (Signed)
Please return call to patient [phone] to discuss medication.

## 2011-12-05 NOTE — Telephone Encounter (Signed)
Spoke with pt. Pt states he has been taking lasix 40mg  daily and not lasix 20mg  daily that he said he was taking at time of appt with Dr Shirlee Latch earlier today. I will forward to Dr Shirlee Latch for new lasix dosing recommendations.

## 2011-12-06 NOTE — Assessment & Plan Note (Signed)
Nonobstructive CAD.  Continue bisoprolol, statin. Check lipids/LFTs with goal LDL < 70.  After starting Xarelto, he can stop ASA given stable coronary disease.

## 2011-12-06 NOTE — Assessment & Plan Note (Addendum)
No evidence for recurrent atrial fibrillation.  He is not currently on anticoagulation.  Apparently there was trouble regulating his INR with coumadin and he stopped Pradaxa.  He only remembered to take Pradaxa once a day when he was taking it.  I talked to him about the importance of anticoagulation for preventing stroke.  He will start on Xarelto 15 mg daily (adjusted for CKD).  He is on amiodarone: check LFTs/TSH, will get repeat PFTs.

## 2011-12-06 NOTE — Assessment & Plan Note (Signed)
NYHA class III symptoms.  He may be mildly volume overloaded, though exam is difficult.  I will increase Lasix to 40 qam, 20 qpm.  BMET/BNP in 1 week.

## 2011-12-06 NOTE — Assessment & Plan Note (Signed)
Continue inhalers, moderate COPD on PFTs.  Repeating PFTs given amiodarone use in the setting of COPD.

## 2011-12-07 ENCOUNTER — Telehealth: Payer: Self-pay | Admitting: Cardiology

## 2011-12-07 ENCOUNTER — Other Ambulatory Visit (HOSPITAL_COMMUNITY): Payer: Self-pay | Admitting: *Deleted

## 2011-12-07 NOTE — Telephone Encounter (Signed)
New Problem:    Called in wanting to know if it would be ok to break the patient's amiodarone (PACERONE) 100 MG tablet pills in half to take.  Please call back.

## 2011-12-07 NOTE — Telephone Encounter (Signed)
Spoke with [facility]. Amiodarone not longer made generically in 100mg  tablet. Pharmacy will give pt amiodarone 200mg  generic  to take 1/2 daily.

## 2011-12-07 NOTE — Telephone Encounter (Signed)
Pt states he is unable to pick up his prescriptions because of insure issues .I called his pharmacy they state they wont wont pay until 12-19-11 . Called pt and told him his refills is ready 12-19-11. He states he has enough to last until that day

## 2011-12-07 NOTE — Telephone Encounter (Signed)
Taking 1/2 of amiodarone 200mg  should not affect medication delivery per [facility].

## 2011-12-11 ENCOUNTER — Telehealth: Payer: Self-pay | Admitting: *Deleted

## 2011-12-11 NOTE — Telephone Encounter (Signed)
Xarelto 15mg  daily approved through 12/10/2012. Reference number [id number]   phone number  [phone]. Pt is aware that this has been authorized.

## 2011-12-12 ENCOUNTER — Ambulatory Visit (INDEPENDENT_AMBULATORY_CARE_PROVIDER_SITE_OTHER): Payer: Medicare Other | Admitting: Internal Medicine

## 2011-12-12 ENCOUNTER — Other Ambulatory Visit: Payer: Medicare Other

## 2011-12-12 ENCOUNTER — Other Ambulatory Visit (INDEPENDENT_AMBULATORY_CARE_PROVIDER_SITE_OTHER): Payer: Medicare Other

## 2011-12-12 DIAGNOSIS — I4891 Unspecified atrial fibrillation: Secondary | ICD-10-CM

## 2011-12-12 DIAGNOSIS — I5032 Chronic diastolic (congestive) heart failure: Secondary | ICD-10-CM

## 2011-12-12 DIAGNOSIS — R0609 Other forms of dyspnea: Secondary | ICD-10-CM

## 2011-12-12 DIAGNOSIS — J449 Chronic obstructive pulmonary disease, unspecified: Secondary | ICD-10-CM

## 2011-12-12 LAB — HEPATIC FUNCTION PANEL
ALT: 34 U/L (ref 0–53)
Albumin: 4.3 g/dL (ref 3.5–5.2)
Alkaline Phosphatase: 99 U/L (ref 39–117)
Bilirubin, Direct: 0.1 mg/dL (ref 0.0–0.3)
Total Protein: 7.2 g/dL (ref 6.0–8.3)

## 2011-12-12 LAB — TSH: TSH: 4.02 u[IU]/mL (ref 0.35–5.50)

## 2011-12-12 LAB — CBC WITH DIFFERENTIAL/PLATELET
Basophils Absolute: 0 10*3/uL (ref 0.0–0.1)
Eosinophils Relative: 2.2 % (ref 0.0–5.0)
HCT: 39.9 % (ref 39.0–52.0)
Hemoglobin: 13.4 g/dL (ref 13.0–17.0)
Lymphocytes Relative: 19.9 % (ref 12.0–46.0)
Lymphs Abs: 1.4 10*3/uL (ref 0.7–4.0)
Monocytes Relative: 10.3 % (ref 3.0–12.0)
Neutro Abs: 4.7 10*3/uL (ref 1.4–7.7)
RBC: 4.31 Mil/uL (ref 4.22–5.81)
RDW: 16 % — ABNORMAL HIGH (ref 11.5–14.6)
WBC: 7 10*3/uL (ref 4.5–10.5)

## 2011-12-12 LAB — PULMONARY FUNCTION TEST

## 2011-12-12 LAB — LIPID PANEL
Cholesterol: 151 mg/dL (ref 0–200)
VLDL: 13.6 mg/dL (ref 0.0–40.0)

## 2011-12-12 LAB — BASIC METABOLIC PANEL
CO2: 31 mEq/L (ref 19–32)
Calcium: 9.8 mg/dL (ref 8.4–10.5)
Chloride: 101 mEq/L (ref 96–112)
Creatinine, Ser: 1.5 mg/dL (ref 0.4–1.5)
Glucose, Bld: 128 mg/dL — ABNORMAL HIGH (ref 70–99)

## 2011-12-12 LAB — BRAIN NATRIURETIC PEPTIDE: Pro B Natriuretic peptide (BNP): 142 pg/mL — ABNORMAL HIGH (ref 0.0–100.0)

## 2011-12-12 NOTE — Progress Notes (Signed)
PFT done today. 

## 2011-12-18 ENCOUNTER — Encounter: Payer: Self-pay | Admitting: Pulmonary Disease

## 2011-12-18 ENCOUNTER — Telehealth: Payer: Self-pay | Admitting: Pulmonary Disease

## 2011-12-18 NOTE — Telephone Encounter (Signed)
PFT 09/15/10>>FEV1 1.68(54%), FEV1% 52, TLC 6.00 (88%), DLCO 84%, +BD  PFT 12/12/11>>FEV1 1.57 (51%), FEV1% 51, TLC 6.03 (89%), DLCO 61%, + BD  He has decrease in diffusing capacity compared to 2012.  He is on amiodarone.  Ct Chest Wo Contrast 09/19/2011  *RADIOLOGY REPORT*  Clinical Data: Follow-up lung cancer.  CT CHEST WITHOUT CONTRAST  Technique:  Multidetector CT imaging of the chest was performed following the standard protocol without IV contrast.  Comparison: Chest CT 04/04/2011.  Findings: The chest wall is unremarkable and stable.  There is a small subcutaneous nodule involving the left lower chest area near the midline.  This is likely a benign sebaceous cyst.  No supraclavicular or axillary lymphadenopathy.  The bony thorax is intact.  No destructive bone lesions or spinal canal compromise. Stable degenerative changes in the thoracic spine.  The heart is normal in size.  No pericardial effusion.  No mediastinal or hilar lymphadenopathy.  Stable atherosclerotic calcifications involving the aorta and branch vessels but no focal aneurysm.  The esophagus is grossly normal.  Examination of the lung parenchyma demonstrates stable left upper lobe surgical changes with brachytherapy seeds.  No CT findings for residual or recurrent tumor.  No findings for metastatic pulmonary disease.  No infiltrates, edema or effusions.  The upper abdomen is unremarkable.  IMPRESSION:  Stable CT appearance of the chest.  No CT findings to suggest recurrent/residual tumor, adenopathy or metastatic disease.  Original Report Authenticated By: P. Loralie Champagne, M.D.    Will have my nurse schedule ROV to review PFT results.  Will forward information to Dr. Shirlee Latch for his review.

## 2011-12-18 NOTE — Telephone Encounter (Signed)
Do you feel like this is related to amiodarone? I will stop if you think so. He has been maintaining NSR well with it.

## 2011-12-19 NOTE — Telephone Encounter (Signed)
Please advise him that he needs to be seen by a pulmonologist based on his PFT changes.  Please ask if he would like to see a different physician besides me, and we can arrange for him to be seen by another provider.

## 2011-12-19 NOTE — Telephone Encounter (Signed)
Difficult to tell if DLCO decrease is from amiodarone.  He had no evidence for amiodarone toxicity on CT chest from May 2013.  Will have him schedule for follow up visit and repeat chest xray.

## 2011-12-19 NOTE — Telephone Encounter (Signed)
I called pt to scheduled OV but stated this is not what he wanted to do. He did not want to make an appt to come in. Please advise Dr. Craige Cotta thanks

## 2011-12-20 NOTE — Telephone Encounter (Signed)
Pt returned call. [name]  

## 2011-12-20 NOTE — Telephone Encounter (Signed)
lmomtcb x1 

## 2011-12-20 NOTE — Telephone Encounter (Signed)
I spoke with pt and agree 'd to come in and be seen. He is scheduled to come in  01/16/12 at 2:45. Will forward to Dr. Craige Cotta so he is aware

## 2011-12-20 NOTE — Telephone Encounter (Signed)
Pt returned our call again Emily E McAlister  °

## 2011-12-20 NOTE — Telephone Encounter (Signed)
Noted  

## 2011-12-31 ENCOUNTER — Encounter: Payer: Self-pay | Admitting: Pulmonary Disease

## 2012-01-16 ENCOUNTER — Ambulatory Visit: Payer: Medicare Other | Admitting: Pulmonary Disease

## 2012-02-05 ENCOUNTER — Ambulatory Visit: Payer: Medicare Other | Admitting: Cardiology

## 2012-05-15 ENCOUNTER — Other Ambulatory Visit: Payer: Self-pay | Admitting: Cardiology

## 2012-07-16 ENCOUNTER — Other Ambulatory Visit: Payer: Self-pay | Admitting: Cardiology

## 2012-08-12 ENCOUNTER — Other Ambulatory Visit: Payer: Self-pay | Admitting: Cardiology

## 2012-08-16 ENCOUNTER — Other Ambulatory Visit: Payer: Self-pay | Admitting: Cardiology

## 2012-10-08 ENCOUNTER — Other Ambulatory Visit: Payer: Self-pay | Admitting: Cardiology

## 2012-10-23 ENCOUNTER — Other Ambulatory Visit: Payer: Self-pay | Admitting: Cardiology

## 2013-01-13 ENCOUNTER — Other Ambulatory Visit (HOSPITAL_COMMUNITY): Payer: Self-pay | Admitting: Family Medicine

## 2013-01-13 DIAGNOSIS — R911 Solitary pulmonary nodule: Secondary | ICD-10-CM

## 2013-01-14 ENCOUNTER — Ambulatory Visit (INDEPENDENT_AMBULATORY_CARE_PROVIDER_SITE_OTHER): Payer: Medicare Other | Admitting: Physician Assistant

## 2013-01-14 ENCOUNTER — Encounter: Payer: Self-pay | Admitting: Physician Assistant

## 2013-01-14 VITALS — BP 148/77 | HR 69 | Wt 245.0 lb

## 2013-01-14 DIAGNOSIS — I10 Essential (primary) hypertension: Secondary | ICD-10-CM

## 2013-01-14 DIAGNOSIS — Z85118 Personal history of other malignant neoplasm of bronchus and lung: Secondary | ICD-10-CM

## 2013-01-14 DIAGNOSIS — I5032 Chronic diastolic (congestive) heart failure: Secondary | ICD-10-CM

## 2013-01-14 DIAGNOSIS — I2510 Atherosclerotic heart disease of native coronary artery without angina pectoris: Secondary | ICD-10-CM

## 2013-01-14 DIAGNOSIS — Z79899 Other long term (current) drug therapy: Secondary | ICD-10-CM

## 2013-01-14 DIAGNOSIS — Z8679 Personal history of other diseases of the circulatory system: Secondary | ICD-10-CM

## 2013-01-14 LAB — BASIC METABOLIC PANEL
Calcium: 9.2 mg/dL (ref 8.4–10.5)
GFR: 49.42 mL/min — ABNORMAL LOW (ref >60.00)
Potassium: 3.8 mEq/L (ref 3.5–5.1)
Sodium: 135 mEq/L (ref 135–145)

## 2013-01-14 LAB — HEPATIC FUNCTION PANEL
Albumin: 3.9 g/dL (ref 3.5–5.2)
Alkaline Phosphatase: 94 U/L (ref 39–117)
Bilirubin, Direct: 0.1 mg/dL (ref 0.0–0.3)

## 2013-01-14 LAB — TSH: TSH: 3.06 u[IU]/mL (ref 0.35–5.50)

## 2013-01-14 MED ORDER — AMIODARONE HCL 200 MG PO TABS: ORAL_TABLET | ORAL | Status: DC

## 2013-01-14 MED ORDER — BISOPROLOL FUMARATE 10 MG PO TABS: 10.0000 mg | ORAL_TABLET | Freq: Every day | ORAL | Status: DC

## 2013-01-14 NOTE — Assessment & Plan Note (Signed)
Patient currently in normal sinus rhythm on amiodarone and Xarelto. Check LFTs TSH and BMET.

## 2013-01-14 NOTE — Addendum Note (Signed)
Addended by: Tonita Phoenix on: 01/14/2013 10:29 AM   Modules accepted: Orders

## 2013-01-14 NOTE — Progress Notes (Signed)
HPI:   This is a very pleasant 72 year old male patient of Dr.McLean who comes in for her yearly followup in order to get his amiodarone prescription refilled. He has a history of paroxysmal atrial fibrillation, diastolic heart failure, COPD, history of lung and colon cancers, moderate nonobstructive CAD, and hypertension. Overall he says he feels weak and tired all the time which is not new for him. He also complains of dyspnea on exertion which is chronic. He says he had a recent CT scan of his chest that shows he might have recurrence of his cancer and is scheduled to see Dr. Dorris Fetch for followup. He last saw Dr. Jearld Pies in July 2013 at which time he was placed on Xarelto. He recently had lab work by Dr. Yehuda Budd.  No Known Allergies  Current Outpatient Prescriptions on File Prior to Visit: albuterol (VENTOLIN HFA) 108 (90 BASE) MCG/ACT inhaler, Inhale 2 puffs into the lungs every 6 (six) hours as needed.  , Disp: , Rfl:  amiodarone (PACERONE) 200 MG tablet, TAKE 1/2 A TABLET BY MOUTH EVERY DAY, Disp: 15 tablet, Rfl: 2 atorvastatin (LIPITOR) 10 MG tablet, Take 1 tablet (10 mg total) by mouth daily., Disp: 30 tablet, Rfl: 3 bisoprolol (ZEBETA) 5 MG tablet, Take 1 tablet (5 mg total) by mouth daily., Disp: 30 tablet, Rfl: 2 FLUoxetine (PROZAC) 20 MG tablet, Take 3 by mouth daily , Disp: , Rfl:  Fluticasone-Salmeterol (ADVAIR DISKUS) 250-50 MCG/DOSE AEPB, Inhale 1 puff into the lungs every 12 (twelve) hours.  , Disp: , Rfl:  furosemide (LASIX) 40 MG tablet, Take 1 in the morning and 1/2 in the afternoon, Disp: 45 tablet, Rfl: 6 HYDROcodone-acetaminophen (NORCO) 5-325 MG per tablet, Take 1 tablet by mouth every 6 (six) hours as needed.  , Disp: , Rfl:  KLOR-CON 10 10 MEQ tablet, TAKE 1 TABLET BY MOUTH EVERY DAY, Disp: 30 tablet, Rfl: 4 Multiple Vitamin (MULTIVITAMIN) tablet, Take 1 tablet by mouth daily.  , Disp: , Rfl:  XARELTO 15 MG TABS tablet, TAKE 1 TABLET BY MOUTH EVERY DAY, Disp: 30 tablet,  Rfl: 2  No current facility-administered medications on file prior to visit.   Past Medical History:   Adenocarcinoma, lung                            2008         Adenocarcinoma of sigmoid colon                 2008         COPD (chronic obstructive pulmonary disease)                   Comment:PFTs 07/11>>FEV1 50%, FEV1% 54, TLC 80%, DLCO               69%   OSA (obstructive sleep apnea)                                  Comment:PSG 06/29/10>>AHI 61.4   Coronary artery disease                                      Hypertension  Diastolic congestive heart failure                           Atrial fibrillation                                            Comment:Chronic amiodarone therapy   Osteoarthritis                                               Chronic kidney disease                                       Pneumonia                                       June 2011    Depression                                                   Low testosterone                                             Emphysema of lung                                            Lung cancer                                                  Basal cell cancer                                            CHF (congestive heart failure)                               Visual disturbance                                           Hearing loss                                                Past Surgical History:   LEFT UPPER LOBE WEDGE RESECTION  2008         TOTAL HIP ARTHROPLASTY                                          Comment:Bilateral   REPLACEMENT TOTAL KNEE BILATERAL                              LOW ANTERIOR BOWEL RESECTION                     2008         CATARACT EXTRACTION W/ INTRAOCULAR LENS  IMPLA*               COLONOSCOPY                                                   COLON SURGERY                                                 skin caner                                                     LUNG CANCER SURGERY                                          Review of patient's family history indicates:   Colon cancer                   Father                   Cancer                         Father                     Comment: colon   Colon cancer                   Sister                   Cancer                         Sister                     Comment: colon   Asthma                         Sister                   Diabetes                       Sister  Social History   Marital Status: Married             Spouse Name:                      Years of Education:                 Number of children:             Occupational History Occupation          Academic librarian [employer]                               Social History Main Topics   Smoking Status: Former Smoker                   Packs/Day: 0.00  Years: 35        Types: Cigarettes     Quit date: 05/14/2006   Smokeless Status: Never Used                       Comment: 1-3 packs a day   Alcohol Use: No                Comment: Quit 2008   Drug Use: No             Sexual Activity: Not on file        Other Topics            Concern   None on file  Social History Narrative   None on file    ROS: See history of present illness otherwise negative  PHYSICAL EXAM: Well-nournished, in no acute distress. Neck: No JVD, HJR, Bruit, or thyroid enlargement  Lungs: Decreased breath sounds throughout but No tachypnea, clear without wheezing, rales, or rhonchi  Cardiovascular: RRR, PMI not displaced, positive S4, no murmurs, bruit, thrill, or heave.  Abdomen: BS normal. Soft without organomegaly, masses, lesions or tenderness.  Extremities: without cyanosis, clubbing or edema. Good distal pulses bilateral  SKin: Small round black lesions on his upper chest unchanged from before according to patient Warm, no lesions or rashes    Musculoskeletal: No deformities  Neuro: no focal signs  BP 148/77  Pulse 69  Wt 245 lb (111.131 kg)  BMI 34.19 kg/m2   EKG: Normal sinus rhythm 70 beats per minute

## 2013-01-14 NOTE — Assessment & Plan Note (Signed)
Patient states he had a recent CT of his lung that showed possible recurrence of lung cancer. We do not have these results. He has followup with Dr. Dorris Fetch scheduled.

## 2013-01-14 NOTE — Patient Instructions (Addendum)
Your physician recommends that you schedule a follow-up appointment in: 6 MONTHS WITH DR [facility] Your physician has recommended you make the following change in your medication:   INCREASE  BISOPROLOL TO  10 MG EVERY DAY Your physician recommends that you return for lab work in: TODAY TSH  BMET  LIVER

## 2013-01-14 NOTE — Assessment & Plan Note (Signed)
Patient's blood pressure slightly elevated today. He says it has been up recently. I will increase his bisoprolol to 10 mg daily. 2 g sodium diet.

## 2013-01-14 NOTE — Assessment & Plan Note (Signed)
Stable without chest pain 

## 2013-01-14 NOTE — Assessment & Plan Note (Signed)
No evidence of heart failure on exam today. 

## 2013-01-16 ENCOUNTER — Encounter (HOSPITAL_COMMUNITY): Payer: Self-pay

## 2013-01-16 ENCOUNTER — Encounter (HOSPITAL_COMMUNITY)
Admission: RE | Admit: 2013-01-16 | Discharge: 2013-01-16 | Disposition: A | Discharge status: Home / Self Care | Payer: Medicare Other | Source: Ambulatory Visit | Attending: Family Medicine | Admitting: Family Medicine

## 2013-01-16 DIAGNOSIS — R911 Solitary pulmonary nodule: Secondary | ICD-10-CM | POA: Insufficient documentation

## 2013-01-16 MED ORDER — FLUDEOXYGLUCOSE F - 18 (FDG) INJECTION
18.1000 | Freq: Once | INTRAVENOUS | Status: AC | PRN
  Administered 2013-01-16: 18.1 via INTRAVENOUS

## 2013-01-20 ENCOUNTER — Other Ambulatory Visit: Payer: Self-pay | Admitting: *Deleted

## 2013-01-20 ENCOUNTER — Institutional Professional Consult (permissible substitution) (INDEPENDENT_AMBULATORY_CARE_PROVIDER_SITE_OTHER): Payer: Medicare Other | Admitting: Thoracic Surgery (Cardiothoracic Vascular Surgery)

## 2013-01-20 ENCOUNTER — Encounter: Payer: Self-pay | Admitting: Thoracic Surgery (Cardiothoracic Vascular Surgery)

## 2013-01-20 VITALS — BP 125/67 | HR 65 | Resp 18 | Ht 72.0 in | Wt 245.0 lb

## 2013-01-20 DIAGNOSIS — D381 Neoplasm of uncertain behavior of trachea, bronchus and lung: Secondary | ICD-10-CM

## 2013-01-20 DIAGNOSIS — Z85118 Personal history of other malignant neoplasm of bronchus and lung: Secondary | ICD-10-CM

## 2013-01-20 DIAGNOSIS — R911 Solitary pulmonary nodule: Secondary | ICD-10-CM

## 2013-01-20 NOTE — Progress Notes (Signed)
PCP is Herb Grays, MD Referring Provider is Herb Grays, MD  Chief Complaint  Patient presents with  . Lung Lesion    new.Marland KitchenMarland KitchenLLLobe....h/o LULobe LUNG CANCER...PET    HPI: 72 year old gentleman sent for evaluation of new left lower lobe nodule.  Mr. 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 He was treated with a wedge resection and radioactive seed implantation by Dr. Edwyna Shell back in 2008. He had been followed since that time with serial scans. In 2013 after 5 years he was released. He has a history of heavy tobacco abuse (70 pack years), but quit in 2008. He also has a history of obstructive sleep apnea, atrial fibrillation, COPD, and at last check mild nonobstructive coronary disease. He also had a colectomy for cancer back in 2008 as well.  He recently had a PET scan. He does not know the reason it was performed, but thinks it was just for a routine followup. A PET scan showed a 7 mm nodule in the superior segment of the left lower lobe in proximity to the previous surgical site. This had an SUV of 4.0. He says he has not been feeling well. He feels tired and has decreased energy. He has not had a change in appetite or weight loss. He has an occasional cough. He does get short of breath with exertion. He has not had any hemoptysis.  ZUBROD score 1   Past Medical History  Diagnosis Date  . Adenocarcinoma, lung 2008  . Adenocarcinoma of sigmoid colon 2008  . COPD (chronic obstructive pulmonary disease)     PFTs 07/11>>FEV1 50%, FEV1% 54, TLC 80%, DLCO 69%  . OSA (obstructive sleep apnea)     PSG 06/29/10>>AHI 61.4  . Coronary artery disease   . Hypertension   . Diastolic congestive heart failure   . Atrial fibrillation     Chronic amiodarone therapy  . Osteoarthritis   . Chronic kidney disease   . Pneumonia June 2011  . Depression   . Low testosterone   . Emphysema of lung   . Lung cancer   . Basal cell cancer   .  CHF (congestive heart failure)   . Visual disturbance   . Hearing loss     Past Surgical History  Procedure Laterality Date  . Left upper lobe wedge resection  2008  . Total hip arthroplasty      Bilateral  . Replacement total knee bilateral    . Low anterior bowel resection  2008  . Cataract extraction w/ intraocular lens  implant, bilateral    . Colonoscopy    . Colon surgery    . Skin caner    . Lung cancer surgery      Family History  Problem Relation Age of Onset  . Colon cancer Father   . Cancer Father     colon  . Colon cancer Sister   . Cancer Sister     colon  . Asthma Sister   . Diabetes Sister     Social History History  Substance Use Topics  . Smoking status: Former Smoker -- 2.00 packs/day for 35 years    Types: Cigarettes    Quit date: 05/14/2006  . Smokeless tobacco: Never Used     Comment: 1-3 packs a day  . Alcohol Use: No     Comment: Quit 2008    Current Outpatient Prescriptions  Medication Sig Dispense Refill  . albuterol (VENTOLIN HFA)  108 (90 BASE) MCG/ACT inhaler Inhale 2 puffs into the lungs every 6 (six) hours as needed.        Marland Kitchen amiodarone (PACERONE) 200 MG tablet TAKE 1/2 A TABLET BY MOUTH EVERY DAY  15 tablet  11  . atorvastatin (LIPITOR) 10 MG tablet Take 1 tablet (10 mg total) by mouth daily.  30 tablet  3  . bisoprolol (ZEBETA) 10 MG tablet Take 1 tablet (10 mg total) by mouth daily.  30 tablet  11  . FLUoxetine (PROZAC) 20 MG tablet Take 3 by mouth daily       . Fluticasone-Salmeterol (ADVAIR DISKUS) 250-50 MCG/DOSE AEPB Inhale 1 puff into the lungs every 12 (twelve) hours.        . furosemide (LASIX) 40 MG tablet Take 1 in the morning and 1/2 in the afternoon  45 tablet  6  . HYDROcodone-acetaminophen (NORCO) 5-325 MG per tablet Take 1 tablet by mouth every 6 (six) hours as needed.        Marland Kitchen KLOR-CON 10 10 MEQ tablet TAKE 1 TABLET BY MOUTH EVERY DAY  30 tablet  4  . Multiple Vitamin (MULTIVITAMIN) tablet Take 1 tablet by mouth  daily.        Carlena Hurl 15 MG TABS tablet TAKE 1 TABLET BY MOUTH EVERY DAY  30 tablet  2   No current facility-administered medications for this visit.    No Known Allergies  Review of Systems  Constitutional: Positive for fatigue. Negative for fever, chills and unexpected weight change.  Respiratory: Positive for cough (No hemoptysis) and shortness of breath.   Cardiovascular: Positive for leg swelling. Negative for chest pain.  Musculoskeletal: Positive for arthralgias (Has had both hips and both knees replaced).  Neurological: Positive for dizziness.  Hematological: Bruises/bleeds easily (on Xarelto).  Psychiatric/Behavioral: The patient is nervous/anxious.   All other systems reviewed and are negative.    BP 125/67  Pulse 65  Resp 18  Ht 6' (1.829 m)  Wt 245 lb (111.131 kg)  BMI 33.22 kg/m2  SpO2 96% Physical Exam  Vitals reviewed. Constitutional: He is oriented to person, place, and time. No distress.  Obese  HENT:  Head: Normocephalic and atraumatic.  Eyes: EOM are normal. Pupils are equal, round, and reactive to light.  Neck: Neck supple.  Cardiovascular: Exam reveals no gallop and no friction rub.   No murmur heard. Irregularly irregular rhythm  Pulmonary/Chest: Effort normal. He has wheezes.  Abdominal: Soft. There is no tenderness.  Musculoskeletal: He exhibits edema (1+).  Lymphadenopathy:    He has no cervical adenopathy.  Neurological: He is alert and oriented to person, place, and time. No cranial nerve deficit.  Skin: Skin is warm and dry. Rash (Punctate purple lesions on anterior chest wall) noted.     Diagnostic Tests: PET/CT CLINICAL DATA: Subsequent treatment strategy for lung carcinoma.  EXAM:  NUCLEAR MEDICINE PET SKULL BASE TO THIGH  TECHNIQUE:  18.1 mCi F-18 FDG was injected intravenously. CT data was obtained  and used for attenuation correction and anatomic localization only.  (This was not acquired as a diagnostic CT examination.)  Additional  exam technical data entered on technologist worksheet.  COMPARISON: 11/09/2010 the and more recent chest CT on 09/19/2011  FINDINGS:  NECK  No hypermetabolic lymph nodes in the neck.  CHEST  Minimal hypermetabolic activity again seen along the surgical site  in the left upper lobe is stable. A new 7 mm noncalcified pulmonary  nodule is seen in the superior segment  of the left lower lobe on  image 78 which shows mild hypermetabolic activity with SUV of 4.0,  which is suspicious for pulmonary metastasis. Previously seen  pulmonary nodule in the right upper lobe is no longer visualized. No  other suspicious pulmonary nodules are identified.  No hypermetabolic mediastinal or hilar lymph nodes are identified.  ABDOMEN/PELVIS  no abnormal hypermetabolic activity within the liver, pancreas,  adrenal glands, or spleen. No hypermetabolic lymph nodes in the  abdomen or pelvis.  SKELETON  no focal hypermetabolic activity to suggest skeletal metastasis.  IMPRESSION:  New mildly hypermetabolic 7 mm pulmonary nodule in the superior  segment of the left lower lobe, suspicious for pulmonary metastasis.  Continued short-term followup by CT is recommended.  No other sites of recurrent or metastatic carcinoma identified.  FASTING BLOOD GLUCLOSE:  170.0  Electronically Signed  By: Myles Rosenthal  On: 01/16/2013 13:56  Pulmonary function tests 12/12/2011  FVC 2.75 (60%) FEV1 1.36 (44%) MVV 132 L/min DC 2.82 (60%) TLC 6.03 (89%) DLCO 61%  Impression: 72 year old male with history of tobacco abuse and COPD. He also has a history of a stage IA non-small cell carcinoma the left upper lobe. This was treated with wedge resection radioactive seed placement by Dr. Edwyna Shell in 2008. He was followed for 5 years with no evidence of recurrent disease. He now has a new 7 mm nodule in the superior segment of the left lower lobe. This lesion was not present on a CT scan from August of 2013. It is  hypermetabolic on PET. It most likely represents a new lung cancer, although it could possibly be inflammatory in nature as well. It is unlikely to be a recurrence of his previous tumor, but that is not out of the question either.  This lesion is small and would be difficult to biopsy either bronchoscopically or percutaneously. Certainly a negative results of the biopsy would be useless. Our options really come down to surgical resection versus radiographic followup. I explained the relative advantages and disadvantages of each of these approaches to him. He would be a high risk surgical candidate due to his cardiac disease, COPD, and previous surgery including radioactive seed implantation. I suspect that the surgery would be quite difficult.  After discussion daily and wishes to follow this radiographically. We will schedule a short interval followup in 8 weeks with a CT scan. If there is any signs of growth we will need to proceed with biopsy or resection. I suspect this is a new cancer and we will ultimately end up operating on him, but given the risk involved I do not want to do that unless absolutely necessary.  Plan: Return in 8 weeks with CT of chest

## 2013-02-10 ENCOUNTER — Other Ambulatory Visit: Payer: Self-pay | Admitting: Cardiology

## 2013-03-17 ENCOUNTER — Ambulatory Visit: Payer: Medicare Other | Admitting: Thoracic Surgery (Cardiothoracic Vascular Surgery)

## 2013-03-17 ENCOUNTER — Other Ambulatory Visit: Payer: Medicare Other

## 2013-03-24 ENCOUNTER — Ambulatory Visit (INDEPENDENT_AMBULATORY_CARE_PROVIDER_SITE_OTHER): Payer: Medicare Other | Admitting: Thoracic Surgery (Cardiothoracic Vascular Surgery)

## 2013-03-24 ENCOUNTER — Encounter: Payer: Self-pay | Admitting: Thoracic Surgery (Cardiothoracic Vascular Surgery)

## 2013-03-24 ENCOUNTER — Ambulatory Visit
Admission: RE | Admit: 2013-03-24 | Discharge: 2013-03-24 | Disposition: A | Discharge status: Home / Self Care | Payer: Medicare Other | Source: Ambulatory Visit | Attending: Thoracic Surgery (Cardiothoracic Vascular Surgery) | Admitting: Thoracic Surgery (Cardiothoracic Vascular Surgery)

## 2013-03-24 VITALS — BP 130/68 | HR 69 | Resp 20 | Ht 72.0 in | Wt 245.0 lb

## 2013-03-24 DIAGNOSIS — R911 Solitary pulmonary nodule: Secondary | ICD-10-CM

## 2013-03-24 DIAGNOSIS — Z85118 Personal history of other malignant neoplasm of bronchus and lung: Secondary | ICD-10-CM

## 2013-03-24 NOTE — Progress Notes (Signed)
HPI:  [name] returns for followup of a left lung nodule.  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 He was treated with a wedge resection and radioactive seed implantation by Dr. Edwyna Shell back in 2008. He had been followed since that time with serial scans. In 2013 after 5 years he was released. He has a history of heavy tobacco abuse (70 pack years), but quit in 2008. He also has a history of obstructive sleep apnea, atrial fibrillation, COPD, and at last check mild nonobstructive coronary disease. He also had a colectomy for cancer back in 2008 as well.   He had a PET scan in September. He does not know the reason it was performed, but thinks it was just for a routine followup. It showed a 7 mm nodule in the superior segment of the left lower lobe in close proximity to the previous surgical site. The nodule had an SUV of 4.0.  I saw him at that time and we discussed our options. Given that this nodule would be very difficult to access bronchoscopically or percutaneously he wished to follow it. He was not interested in undergoing a redo thoracotomy for the nodule. We elected to have him return with a 8 week followup scan to ensure the lesion was not growing rapidly.  His wife passed away last week. He's been feeling poorly due to that. His appetite has been poor over the last couple of weeks. His pulmonary status is unchanged.  Past Medical History  Diagnosis Date  . Adenocarcinoma, lung 2008  . Adenocarcinoma of sigmoid colon 2008  . COPD (chronic obstructive pulmonary disease)     PFTs 07/11>>FEV1 50%, FEV1% 54, TLC 80%, DLCO 69%  . OSA (obstructive sleep apnea)     PSG 06/29/10>>AHI 61.4  . Coronary artery disease   . Hypertension   . Diastolic congestive heart failure   . Atrial fibrillation     Chronic amiodarone therapy  . Osteoarthritis   . Chronic kidney disease   . Pneumonia June 2011  . Depression   . Low testosterone    . Emphysema of lung   . Lung cancer   . Basal cell cancer   . CHF (congestive heart failure)   . Visual disturbance   . Hearing loss        Current Outpatient Prescriptions  Medication Sig Dispense Refill  . albuterol (VENTOLIN HFA) 108 (90 BASE) MCG/ACT inhaler Inhale 2 puffs into the lungs every 6 (six) hours as needed.        . ALPRAZolam (XANAX) 0.5 MG tablet       . amiodarone (PACERONE) 200 MG tablet TAKE 1/2 A TABLET BY MOUTH EVERY DAY  15 tablet  11  . aspirin 81 MG tablet Take 81 mg by mouth daily.      Marland Kitchen atorvastatin (LIPITOR) 10 MG tablet Take 1 tablet (10 mg total) by mouth daily.  30 tablet  3  . bisoprolol (ZEBETA) 10 MG tablet Take 1 tablet (10 mg total) by mouth daily.  30 tablet  11  . FLUoxetine (PROZAC) 20 MG tablet Take 3 by mouth daily       . Fluticasone-Salmeterol (ADVAIR DISKUS) 250-50 MCG/DOSE AEPB Inhale 1 puff into the lungs every 12 (twelve) hours.        . furosemide (LASIX) 40 MG tablet Take 1 in the morning and 1/2 in the afternoon  45 tablet  6  . HYDROcodone-acetaminophen (  NORCO) 5-325 MG per tablet Take 1 tablet by mouth every 6 (six) hours as needed.        . Multiple Vitamin (MULTIVITAMIN) tablet Take 1 tablet by mouth daily.        . potassium chloride (KLOR-CON 10) 10 MEQ tablet TAKE 1 TABLET BY MOUTH EVERY DAY  30 tablet  4   No current facility-administered medications for this visit.    Physical Exam BP 130/68  Pulse 69  Resp 20  Ht 6' (1.829 m)  Wt 245 lb (111.131 kg)  BMI 33.22 kg/m2  SpO21 9% 72 year old man in no acute distress Overweight No cervical or subclavicular adenopathy Cardiac regular rate and rhythm normal S1 and S2 Lungs diminished breath sounds bilaterally  Diagnostic Tests: CT chest 03/24/2013 CT CHEST WITHOUT CONTRAST  TECHNIQUE:  Multidetector CT imaging of the chest was performed following the  standard protocol without IV contrast.  COMPARISON: Several prior examinations. Most recent head CT   01/16/2013. Comparison chest CTs 01/16/2013, 09/19/2011, 04/04/2011  and 10/31/2010.  FINDINGS:  Prior left upper lobe surgery with radiation seed implants in place.  The operative site appears similar to several prior exams.  Immediately posterior to the operative site there is a nodule which  now has transverse dimension of 11.5 x 6.1 mm (series 4, image 20)  versus prior 11.8 x 6.9 mm. Tumor cannot be excluded.  Patient has a history of a right upper lobe nodule which cleared  since the 10/2017 2012 exam.  No other nodular density is noted.  Remainder of findings are similar to that of the prior exam  including atherosclerotic type changes (coronary artery  calcification/ ectasia of the thoracic aorta measuring up to 3.8  cm).  IMPRESSION:  Nodule posterior to the left upper lobe cancer treatment site  appears relatively similar to prior exam as noted above.  Electronically Signed  By: Bridgett Larsson M.D.  On: 03/24/2013 11:27  Impression: 72 year old gentleman with a history of a previous lung cancer treated with a wedge resection in radioactive seeds. They're now is a new nodule adjacent to his previous surgical site. This was found on PET CT that was done in September. There has been no interval change in the nodule over the past 8 weeks. This does not rule out malignancy and the lesion needs to continue to be followed.  Redo surgery remains an option, but understandably, he is not at all interested in doing that while dealing with his wife's death.   Plan: Return in 8 weeks with repeat CT of chest to followup left lung nodule.

## 2013-05-11 ENCOUNTER — Other Ambulatory Visit: Payer: Self-pay | Admitting: Cardiology

## 2013-05-18 ENCOUNTER — Other Ambulatory Visit: Payer: Self-pay

## 2013-05-18 DIAGNOSIS — D381 Neoplasm of uncertain behavior of trachea, bronchus and lung: Secondary | ICD-10-CM

## 2013-05-25 ENCOUNTER — Other Ambulatory Visit: Payer: Self-pay | Admitting: Cardiology

## 2013-06-02 ENCOUNTER — Ambulatory Visit: Payer: Medicare HMO | Admitting: Thoracic Surgery (Cardiothoracic Vascular Surgery)

## 2013-06-02 ENCOUNTER — Other Ambulatory Visit: Payer: Medicare Other

## 2013-06-09 ENCOUNTER — Other Ambulatory Visit: Payer: Medicare Other

## 2013-06-09 ENCOUNTER — Ambulatory Visit: Payer: Medicare Other | Admitting: Thoracic Surgery (Cardiothoracic Vascular Surgery)

## 2013-06-09 ENCOUNTER — Ambulatory Visit
Admission: RE | Admit: 2013-06-09 | Discharge: 2013-06-09 | Disposition: A | Discharge status: Home / Self Care | Payer: Medicare HMO | Source: Ambulatory Visit | Attending: Thoracic Surgery (Cardiothoracic Vascular Surgery) | Admitting: Thoracic Surgery (Cardiothoracic Vascular Surgery)

## 2013-06-09 ENCOUNTER — Encounter: Payer: Self-pay | Admitting: Thoracic Surgery (Cardiothoracic Vascular Surgery)

## 2013-06-09 ENCOUNTER — Ambulatory Visit (INDEPENDENT_AMBULATORY_CARE_PROVIDER_SITE_OTHER): Payer: Medicare HMO | Admitting: Thoracic Surgery (Cardiothoracic Vascular Surgery)

## 2013-06-09 VITALS — BP 98/54 | HR 63 | Resp 20 | Ht 72.0 in | Wt 241.0 lb

## 2013-06-09 DIAGNOSIS — D381 Neoplasm of uncertain behavior of trachea, bronchus and lung: Secondary | ICD-10-CM

## 2013-06-09 DIAGNOSIS — Z85118 Personal history of other malignant neoplasm of bronchus and lung: Secondary | ICD-10-CM

## 2013-06-09 DIAGNOSIS — R911 Solitary pulmonary nodule: Secondary | ICD-10-CM

## 2013-06-09 NOTE — Progress Notes (Signed)
HPI:  [name] returns today for a scheduled 8 week followup visit.  [name] returns for followup of a left lung nodule.  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 He was treated with a wedge resection and radioactive seed implantation by Dr. Arlyce Dice back in 2008. He had been followed since that time with serial scans. In 2013 after 5 years he was released. He has a history of heavy tobacco abuse (70 pack years), but quit in 2008. He also has a history of obstructive sleep apnea, atrial fibrillation, COPD, and at last check mild nonobstructive coronary disease. He also had a colectomy for cancer back in 2008 as well.   He had a PET scan in September. He does not know the reason it was performed, but thinks it was just for a routine followup. It showed a 7 mm nodule in the superior segment of the left lower lobe in close proximity to the previous surgical site. The nodule had an SUV of 4.0.   I saw him at that time and we discussed our options. Given that this nodule would be very difficult to access bronchoscopically or percutaneously he wished to follow it. He was not interested in undergoing a redo thoracotomy for the nodule. We elected to have him return with a 8 week followup scan to ensure the lesion was not growing rapidly.   When I saw him back in November the nodule was unchanged. His wife had passed away shortly before that visit and he was having a tough time dealing with that. Understandably he was managed with surgery at that time so I recommended that he come back with a 8 week followup scan once again.  In the interim since his visit in November he says that he still is depressed about his wife's death. He is not having any chest pain or shortness of breath. His weight has been stable. He denies any unusual cough or hemoptysis.     Past Medical History  Diagnosis Date  . Adenocarcinoma, lung 2008  . Adenocarcinoma of  sigmoid colon 2008  . COPD (chronic obstructive pulmonary disease)     PFTs 07/11>>FEV1 50%, FEV1% 54, TLC 80%, DLCO 69%  . OSA (obstructive sleep apnea)     PSG 06/29/10>>AHI 61.4  . Coronary artery disease   . Hypertension   . Diastolic congestive heart failure   . Atrial fibrillation     Chronic amiodarone therapy  . Osteoarthritis   . Chronic kidney disease   . Pneumonia June 2011  . Depression   . Low testosterone   . Emphysema of lung   . Lung cancer   . Basal cell cancer   . CHF (congestive heart failure)   . Visual disturbance   . Hearing loss     Current Outpatient Prescriptions  Medication Sig Dispense Refill  . albuterol (VENTOLIN HFA) 108 (90 BASE) MCG/ACT inhaler Inhale 2 puffs into the lungs every 6 (six) hours as needed.        . ALPRAZolam (XANAX) 0.5 MG tablet       . amiodarone (PACERONE) 200 MG tablet TAKE 1/2 A TABLET BY MOUTH EVERY DAY  15 tablet  2  . aspirin 81 MG tablet Take 81 mg by mouth daily.      Marland Kitchen atorvastatin (LIPITOR) 10 MG tablet Take 1 tablet (10 mg total) by mouth daily.  30 tablet  3  . bisoprolol (ZEBETA) 10  MG tablet Take 1 tablet (10 mg total) by mouth daily.  30 tablet  11  . FLUoxetine (PROZAC) 20 MG tablet Take 3 by mouth daily       . Fluticasone-Salmeterol (ADVAIR DISKUS) 250-50 MCG/DOSE AEPB Inhale 1 puff into the lungs every 12 (twelve) hours.        . furosemide (LASIX) 40 MG tablet Take 1 in the morning and 1/2 in the afternoon  45 tablet  6  . HYDROcodone-acetaminophen (NORCO) 5-325 MG per tablet Take 1 tablet by mouth every 6 (six) hours as needed.        . metFORMIN (GLUCOPHAGE) 500 MG tablet Take by mouth 2 (two) times daily with a meal.      . Multiple Vitamin (MULTIVITAMIN) tablet Take 1 tablet by mouth daily.        . potassium chloride (KLOR-CON 10) 10 MEQ tablet TAKE 1 TABLET BY MOUTH EVERY DAY  30 tablet  4   No current facility-administered medications for this visit.    Physical Exam BP 98/54  Pulse 63  Resp 20   Ht 6' (1.829 m)  Wt 241 lb (109.317 kg)  BMI 32.68 kg/m2  SpO9 76% 73 year old male in no acute distress Alert and oriented x3 with no focal neurologic deficits Lungs diminished breath sounds bilaterally, no rales or wheezing No cervical or suprapubic or adenopathy  Diagnostic Tests: CT chest 06/09/2013 CT CHEST WITHOUT CONTRAST  TECHNIQUE:  Multidetector CT imaging of the chest was performed following the  standard protocol without IV contrast.  COMPARISON: 03/24/2013  FINDINGS:  Postsurgical changes with brachytherapy seeds in the posterior left  upper lobe.  Adjacent 5 x 7 mm nodule in in the superior segment left lower lobe  (series 4/image 15). While the location remains suspicious for  possible recurrence/metastasis, this appears mildly improved from  the recent prior, when it measured 6 x 11 mm.  Otherwise, no new/ suspicious pulmonary nodules. Mild paraseptal  emphysematous changes. No pleural effusion or pneumothorax.  Visualized thyroid is unremarkable.  The heart is normal in size. No pericardial effusion. Coronary  atherosclerosis. Atherosclerotic calcifications of the aortic arch.  No suspicious mediastinal or axillary lymphadenopathy.  Visualized upper abdomen is notable for mild hepatic steatosis and  vascular calcifications.  Degenerative changes of the visualized thoracolumbar spine.  IMPRESSION:  Postsurgical changes with brachytherapy seeds in the posterior left  upper lobe.  Adjacent 5 x 7 mm nodule in the superior segment left lower lobe,  possibly mildly decreased. Continued attention on follow-up is  suggested.  Electronically Signed  By: Julian Hy M.D.  On: 06/09/2013 14:36  Impression: 73 year old gentleman with a history of lung cancer treated with wedge resection in radioactive seed implantation by Dr. Arlyce Dice many years ago. There is a nodular projection from the staple line/ seed location. On CT today it appears this may be slightly smaller  than it was previously. Also appears more linear and more scarlike rather than nodular. In any event it definitely has not progressed over the past two 8 week intervals. I do think we need to continue to follow this closely. I recommended to him that we repeat his CT scan in 3 months.  Plan:  Return in 3 months with CT of chest

## 2013-06-29 ENCOUNTER — Encounter: Payer: Self-pay | Admitting: Cardiology

## 2013-07-15 ENCOUNTER — Other Ambulatory Visit: Payer: Self-pay

## 2013-07-15 MED ORDER — AMIODARONE HCL 200 MG PO TABS: ORAL_TABLET | ORAL | Status: DC

## 2013-07-28 ENCOUNTER — Other Ambulatory Visit: Payer: Self-pay | Admitting: *Deleted

## 2013-07-28 DIAGNOSIS — R911 Solitary pulmonary nodule: Secondary | ICD-10-CM

## 2013-09-08 ENCOUNTER — Encounter: Payer: Self-pay | Admitting: Thoracic Surgery (Cardiothoracic Vascular Surgery)

## 2013-09-08 ENCOUNTER — Ambulatory Visit (INDEPENDENT_AMBULATORY_CARE_PROVIDER_SITE_OTHER): Payer: Medicare HMO | Admitting: Thoracic Surgery (Cardiothoracic Vascular Surgery)

## 2013-09-08 ENCOUNTER — Other Ambulatory Visit: Payer: Self-pay | Admitting: *Deleted

## 2013-09-08 ENCOUNTER — Ambulatory Visit
Admission: RE | Admit: 2013-09-08 | Discharge: 2013-09-08 | Disposition: A | Discharge status: Home / Self Care | Payer: Medicare HMO | Source: Ambulatory Visit | Attending: Thoracic Surgery (Cardiothoracic Vascular Surgery) | Admitting: Thoracic Surgery (Cardiothoracic Vascular Surgery)

## 2013-09-08 VITALS — BP 112/62 | HR 74 | Resp 16 | Ht 72.0 in | Wt 241.0 lb

## 2013-09-08 DIAGNOSIS — Z85118 Personal history of other malignant neoplasm of bronchus and lung: Secondary | ICD-10-CM

## 2013-09-08 DIAGNOSIS — R911 Solitary pulmonary nodule: Secondary | ICD-10-CM

## 2013-09-08 DIAGNOSIS — D381 Neoplasm of uncertain behavior of trachea, bronchus and lung: Secondary | ICD-10-CM

## 2013-09-08 NOTE — Progress Notes (Signed)
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 Arlyce Dice. He had a PET CT done in September which showed some metabolic activity in the setting of the soft tissue mass in the region of his previous wedge and seed placement. He was going through some difficult times and did not wish to have any aggressive evaluation or intervention and so we elected to follow that. He was last in the office in January at which time there was no change in the soft tissue at the previous operative site and no evidence of disease elsewhere. He now returns for 3 month followup.  He says that he's been feeling tired with a lack of energy. He denies any fevers or chills. He's not had any chest pain. He does have some chronic shortness of breath. He says he's lost 27 pounds through diet and exercise.  Past Medical History  Diagnosis Date  . Adenocarcinoma, lung 2008  . Adenocarcinoma of sigmoid colon 2008  . COPD (chronic obstructive pulmonary disease)     PFTs 07/11>>FEV1 50%, FEV1% 54, TLC 80%, DLCO 69%  . OSA (obstructive sleep apnea)     PSG 06/29/10>>AHI 61.4  . Coronary artery disease   . Hypertension   . Diastolic congestive heart failure   . Atrial fibrillation     Chronic amiodarone therapy  . Osteoarthritis   . Chronic kidney disease   . Pneumonia June 2011  . Depression   . Low testosterone   . Emphysema of lung   . Lung cancer   . Basal cell cancer   . CHF (congestive heart failure)   . Visual disturbance   . Hearing loss       Current Outpatient Prescriptions  Medication Sig Dispense Refill  . albuterol (VENTOLIN HFA) 108 (90 BASE) MCG/ACT inhaler Inhale 2 puffs into the lungs every 6 (six) hours as needed.        . ALPRAZolam (XANAX) 0.5 MG tablet       . amiodarone (PACERONE) 200 MG tablet TAKE 1/2 A TABLET BY MOUTH EVERY DAY  45 tablet  2  .  aspirin 81 MG tablet Take 81 mg by mouth daily.      Marland Kitchen atorvastatin (LIPITOR) 10 MG tablet Take 1 tablet (10 mg total) by mouth daily.  30 tablet  3  . bisoprolol (ZEBETA) 10 MG tablet Take 1 tablet (10 mg total) by mouth daily.  30 tablet  11  . FLUoxetine (PROZAC) 20 MG tablet Take 3 by mouth daily       . Fluticasone-Salmeterol (ADVAIR DISKUS) 250-50 MCG/DOSE AEPB Inhale 1 puff into the lungs every 12 (twelve) hours.        . furosemide (LASIX) 40 MG tablet Take 1 in the morning and 1/2 in the afternoon  45 tablet  6  . HYDROcodone-acetaminophen (NORCO) 5-325 MG per tablet Take 1 tablet by mouth every 6 (six) hours as needed.        . metFORMIN (GLUCOPHAGE) 500 MG tablet Take by mouth 2 (two) times daily with a meal.      . Multiple Vitamin (MULTIVITAMIN) tablet Take 1 tablet by mouth daily.        . potassium chloride (KLOR-CON 10) 10 MEQ tablet TAKE 1 TABLET BY MOUTH EVERY DAY  30 tablet  4   No current facility-administered medications for this visit.  Physical Exam BP 112/62  Pulse 74  Resp 16  Ht 6' (1.829 m)  Wt 241 lb (109.317 kg)  BMI 32.68 kg/m2  SpO81 93% 73 year old male in no acute distress Alert and oriented x3 with no focal neurologic deficits Cardiac regular rate and rhythm normal S1-S2 Diminished breath sounds bilaterally no rales or wheezes  Diagnostic Tests: CT chest 09/08/2013 CT CHEST WITHOUT CONTRAST  TECHNIQUE:  Multidetector CT imaging of the chest was performed following the  standard protocol without IV contrast.  COMPARISON: CT chest of 06/09/2013  FINDINGS:  On lung window images, there is a new nodular lesion posteriorly and  laterally within the right lower lobe measuring 14 mm on image  number 53. This lesion is worrisome for contralateral metastatic  deposit. Radioactive seed implants within the left upper hemi thorax  are stable. Minimal adjacent nodularity is stable as well. No  evidence of local recurrence is seen. No pleural effusion is  noted.  The central airway is patent.  On soft tissue window images, the thyroid gland is unremarkable. On  this unenhanced study, no mediastinal or hilar adenopathy is noted.  Coronary artery calcifications and calcification of the aortic arch  is noted. Mild cardiomegaly is stable. The upper abdomen on this  unenhanced study is unremarkable. There are diffuse degenerative  changes throughout the thoracic spine.  IMPRESSION:  1. New nodular opacity peripherally in the right lower lobe  worrisome for a contralateral metastatic lesion.  2. Stable radioactive seed implants within the left upper lung field  with no evidence of local recurrence.  Electronically Signed  By: Ivar Drape M.D.  On: 09/08/2013 13:34   Impression: 73 year old gentleman with a history of tobacco abuse and history of a stage IA non-small cell carcinoma of his left upper lobe. We have been following him for some soft tissue density at the staple line. At that has remained stable. Unfortunately there is a new right lower lobe nodule which is 14 mm in diameter apparent on his scan today. This is worrisome for a second primary or a metastatic lesion. Although it is a little unusual for something that size to not have been present at all on his scan 3 months ago, it is not overgrown with possibility. Also given the location of this lesion in the very inferior aspect of the lower lobe I could of been somewhat mass by the diaphragm with a less than complete inspiration during his last couple of scans. In any event I think we need to take this seriously and investigate further. I recommended that we do a PET CT to see if there is any metabolic activity in this nodule. If there isn't will need to biopsy. This should be able to be done relatively easily percutaneously. Another option would be to proceed with wedge resection for definitive diagnosis and treatment at the same time.  Plan: PET/CT  Return in 2 weeks to further  discuss

## 2013-09-15 ENCOUNTER — Ambulatory Visit (HOSPITAL_COMMUNITY)
Admission: RE | Admit: 2013-09-15 | Discharge: 2013-09-15 | Disposition: A | Discharge status: Home / Self Care | Payer: Medicare HMO | Source: Ambulatory Visit | Attending: Thoracic Surgery (Cardiothoracic Vascular Surgery) | Admitting: Thoracic Surgery (Cardiothoracic Vascular Surgery)

## 2013-09-15 ENCOUNTER — Encounter (HOSPITAL_COMMUNITY): Payer: Self-pay

## 2013-09-15 DIAGNOSIS — C3490 Malignant neoplasm of unspecified part of unspecified bronchus or lung: Secondary | ICD-10-CM | POA: Insufficient documentation

## 2013-09-15 DIAGNOSIS — R911 Solitary pulmonary nodule: Secondary | ICD-10-CM | POA: Insufficient documentation

## 2013-09-15 LAB — GLUCOSE, CAPILLARY: GLUCOSE-CAPILLARY: 75 mg/dL (ref 70–99)

## 2013-09-15 MED ORDER — FLUDEOXYGLUCOSE F - 18 (FDG) INJECTION
12.2000 | Freq: Once | INTRAVENOUS | Status: AC | PRN
  Administered 2013-09-15: 12.2 via INTRAVENOUS

## 2013-09-15 NOTE — Discharge Instructions (Signed)
Pulmonary Nodule A pulmonary nodule is a small, round growth of tissue in the lung. Pulmonary nodules can range in size from less than 1/5 inch (4 mm) to a little bigger than an inch (25 mm). Most pulmonary nodules are detected when imaging tests of the lung are being performed for a different problem. Pulmonary nodules are usually not cancerous (benign). However, some pulmonary nodules are cancerous (malignant). Follow-up treatment or testing is based on the size of the pulmonary nodule and your risk of getting lung cancer.  CAUSES Benign pulmonary nodules can be caused by various things. Some of the causes include:   Bacterial, fungal, or viral infections. This is usually an old infection that is no longer active, but it can sometimes be a current, active infection.  A benign mass of tissue.  Inflammation from conditions such as rheumatoid arthritis.   Abnormal blood vessels in the lungs. Malignant pulmonary nodules can result from lung cancer or from cancers that spread to the lung from other places in the body. SIGNS AND SYMPTOMS Pulmonary nodules usually do not cause symptoms. DIAGNOSIS Most often, pulmonary nodules are found incidentally when an X-ray or CT scan is performed to look for some other problem in the lung area. To help determine whether a pulmonary nodule is benign or malignant, your health care provider will take a medical history and order a variety of tests. Tests done may include:   Blood tests.  A skin test called a tuberculin test. This test is used to determine if you have been exposed to the germ that causes tuberculosis.   Chest X-rays. If possible, a new X-ray may be compared with X-rays you have had in the past.   CT scan. This test shows smaller pulmonary nodules more clearly than an X-ray.   Positron emission tomography (PET) scan. In this test, a safe amount of a radioactive substance is injected into the bloodstream. Then, the scan takes a picture of  the pulmonary nodule. The radioactive substance is eliminated from your body in your urine.   Biopsy. A tiny piece of the pulmonary nodule is removed so it can be checked under a microscope. TREATMENT  Pulmonary nodules that are benign normally do not require any treatment because they usually do not cause symptoms or breathing problems. Your health care provider may want to monitor the pulmonary nodule through follow-up CT scans. The frequency of these CT scans will vary based on the size of the nodule and the risk factors for lung cancer. For example, CT scans will need to be done more frequently if the pulmonary nodule is larger and if you have a history of smoking and a family history of cancer. Further testing or biopsies may be done if any follow-up CT scan shows that the size of the pulmonary nodule has increased. HOME CARE INSTRUCTIONS  Only take over-the-counter or prescription medicines as directed by your health care provider.  Keep all follow-up appointments with your health care provider. SEEK MEDICAL CARE IF:  You have trouble breathing when you are active.   You feel sick or unusually tired.   You do not feel like eating.   You lose weight without trying to.   You develop chills or night sweats.  SEEK IMMEDIATE MEDICAL CARE IF:  You cannot catch your breath, or you begin wheezing.   You cannot stop coughing.   You cough up blood.   You become dizzy or feel like you are going to pass out.   You  have sudden chest pain.   You have a fever or persistent symptoms for more than 2 3 days.   You have a fever and your symptoms suddenly get worse. MAKE SURE YOU:  Understand these instructions.  Will watch your condition.  Will get help right away if you are not doing well or get worse. Document Released: 02/25/2009 Document Revised: 12/31/2012 Document Reviewed: 10/20/2012 [facility] Patient Information 2014 [employer].

## 2013-09-22 ENCOUNTER — Encounter: Payer: Self-pay | Admitting: Thoracic Surgery (Cardiothoracic Vascular Surgery)

## 2013-09-22 ENCOUNTER — Ambulatory Visit (INDEPENDENT_AMBULATORY_CARE_PROVIDER_SITE_OTHER): Payer: Medicare HMO | Admitting: Thoracic Surgery (Cardiothoracic Vascular Surgery)

## 2013-09-22 VITALS — BP 87/52 | HR 69 | Resp 20 | Ht 72.0 in | Wt 241.0 lb

## 2013-09-22 DIAGNOSIS — R911 Solitary pulmonary nodule: Secondary | ICD-10-CM

## 2013-09-22 DIAGNOSIS — D381 Neoplasm of uncertain behavior of trachea, bronchus and lung: Secondary | ICD-10-CM

## 2013-09-22 DIAGNOSIS — Z85118 Personal history of other malignant neoplasm of bronchus and lung: Secondary | ICD-10-CM

## 2013-09-22 NOTE — Progress Notes (Signed)
HPI: [name] returns today to discuss results of his PET/CT.  He is a 73 year old former smoker with a history of COPD and a lung cancer. He had a stage IA cancer resected by Dr. Arlyce Dice back in 2008. He had a wedge resection and radioactive seed implant at that time. I saw him in the office on April 28. A CT scan showed a new 1.4 cm right lower lobe nodule. I recommended a PET/CT which was done on May 5. He now returns to review the results.  He says that in the interim since his last visit his blood pressure medication (he's not sure of the name) was increased from 10 mg to 100 mg and his been feeling lightheaded.    Past Medical History  Diagnosis Date  . Adenocarcinoma, lung 2008  . Adenocarcinoma of sigmoid colon 2008  . COPD (chronic obstructive pulmonary disease)     PFTs 07/11>>FEV1 50%, FEV1% 54, TLC 80%, DLCO 69%  . OSA (obstructive sleep apnea)     PSG 06/29/10>>AHI 61.4  . Coronary artery disease   . Hypertension   . Diastolic congestive heart failure   . Atrial fibrillation     Chronic amiodarone therapy  . Osteoarthritis   . Chronic kidney disease   . Pneumonia June 2011  . Depression   . Low testosterone   . Emphysema of lung   . Lung cancer   . Basal cell cancer   . CHF (congestive heart failure)   . Visual disturbance   . Hearing loss     Current Outpatient Prescriptions  Medication Sig Dispense Refill  . albuterol (VENTOLIN HFA) 108 (90 BASE) MCG/ACT inhaler Inhale 2 puffs into the lungs every 6 (six) hours as needed.        . ALPRAZolam (XANAX) 0.5 MG tablet 0.5 mg 3 (three) times daily as needed.       Marland Kitchen amiodarone (PACERONE) 200 MG tablet TAKE 1/2 A TABLET BY MOUTH EVERY DAY  45 tablet  2  . aspirin 81 MG tablet Take 81 mg by mouth daily.      . bisoprolol (ZEBETA) 10 MG tablet Take 1 tablet (10 mg total) by mouth daily.  30 tablet  11  . FLUoxetine (PROZAC) 20 MG tablet Take 3 by mouth daily       . Fluticasone-Salmeterol (ADVAIR DISKUS) 250-50  MCG/DOSE AEPB Inhale 1 puff into the lungs every 12 (twelve) hours.        . furosemide (LASIX) 40 MG tablet Take 1 in the morning and 1/2 in the afternoon  45 tablet  6  . HYDROcodone-acetaminophen (NORCO) 5-325 MG per tablet Take 1 tablet by mouth every 6 (six) hours as needed.        . metFORMIN (GLUCOPHAGE) 500 MG tablet Take by mouth 2 (two) times daily with a meal.      . Multiple Vitamin (MULTIVITAMIN) tablet Take 1 tablet by mouth daily.        . potassium chloride (KLOR-CON 10) 10 MEQ tablet TAKE 1 TABLET BY MOUTH EVERY DAY  30 tablet  4   No current facility-administered medications for this visit.    Physical Exam BP 87/52  Pulse 69  Resp 20  Ht 6' (1.829 m)  Wt 241 lb (109.317 kg)  BMI 32.68 kg/m2  SpO22 100% 73 year old in no acute distress Well-developed well-nourished Diminished breath sounds bilaterally Cardiac regular rate and rhythm no murmur   Diagnostic Tests: PET/CT 09/15/2013 NUCLEAR MEDICINE PET SKULL BASE TO THIGH  TECHNIQUE:  12.2 mCi F-18 FDG was injected intravenously. Full-ring PET imaging  was performed from the skull base to thigh after the radiotracer. CT  data was obtained and used for attenuation correction and anatomic  localization.  FASTING BLOOD GLUCOSE: Value: 75 mg/dl  COMPARISON: Chest CT on 09/08/2013 and PET-CT on 01/16/2013  FINDINGS:  NECK  No hypermetabolic lymph nodes in the neck.  CHEST  No hypermetabolic mediastinal or hilar nodes. 1.2 cm nodular density  in the posterior right lower lobe on image 96 shows no significant  hypermetabolic activity. Stable postop changes and brachytherapy  seeds seen in the posterior left upper lobe. No other suspicious  pulmonary nodules identified.  ABDOMEN/PELVIS  No abnormal hypermetabolic activity within the liver, pancreas,  adrenal glands, or spleen. No hypermetabolic lymph nodes in the  abdomen or pelvis.  SKELETON  No focal hypermetabolic activity to suggest skeletal metastasis.   IMPRESSION:  1.2 cm pulmonary nodule in the posterior right lower lobe shows no  significant hypermetabolic activity. Continued close followup by CT  recommended in 3-4 months.  No other sites of malignancy identified.  Electronically Signed  By: Earle Gell M.D.  On: 09/15/2013 13:47  Impression: 73 year old gentleman with a history of a stage IA non-small cell carcinoma. He has a new 1.4 cm nodule at the right base. This was not hypermetabolic by PET CT. There also was no other sign of malignancy elsewhere.  I suspect this nodule may be round atelectasis. However, neither that suspicion nor the negative PET completely rule out the possibility that this is a low-grade malignancy. He does need continued followup.  Plan:  Return in 3 months with CT of chest

## 2013-10-12 ENCOUNTER — Encounter: Payer: Self-pay | Admitting: Physician Assistant

## 2013-10-12 ENCOUNTER — Ambulatory Visit (INDEPENDENT_AMBULATORY_CARE_PROVIDER_SITE_OTHER): Payer: Medicare HMO | Admitting: Physician Assistant

## 2013-10-12 ENCOUNTER — Encounter (INDEPENDENT_AMBULATORY_CARE_PROVIDER_SITE_OTHER): Payer: Self-pay

## 2013-10-12 VITALS — BP 119/67 | HR 64 | Ht 72.0 in | Wt 230.1 lb

## 2013-10-12 DIAGNOSIS — I4891 Unspecified atrial fibrillation: Secondary | ICD-10-CM

## 2013-10-12 DIAGNOSIS — I5032 Chronic diastolic (congestive) heart failure: Secondary | ICD-10-CM

## 2013-10-12 DIAGNOSIS — I2510 Atherosclerotic heart disease of native coronary artery without angina pectoris: Secondary | ICD-10-CM

## 2013-10-12 DIAGNOSIS — I10 Essential (primary) hypertension: Secondary | ICD-10-CM

## 2013-10-12 MED ORDER — AMIODARONE HCL 200 MG PO TABS: ORAL_TABLET | ORAL | Status: DC

## 2013-10-12 MED ORDER — POTASSIUM CHLORIDE ER 10 MEQ PO TBCR: 10.0000 meq | EXTENDED_RELEASE_TABLET | Freq: Once | ORAL | Status: DC

## 2013-10-12 NOTE — Assessment & Plan Note (Signed)
Stable without chest pain 

## 2013-10-12 NOTE — Assessment & Plan Note (Signed)
No evidence of heart failure on exam 

## 2013-10-12 NOTE — Assessment & Plan Note (Addendum)
Currently in normal sinus rhythm on amiodarone. Will refill. Had chemistry profile and TSH in September that were stable

## 2013-10-12 NOTE — Assessment & Plan Note (Signed)
Well-controlled since increasing bisoprolol

## 2013-10-12 NOTE — Progress Notes (Signed)
HPI This is a very pleasant 73 year old male patient of Dr.McLean who comes in for  yearly followup in order to get his amiodarone prescription refilled. He has a history of paroxysmal atrial fibrillation, diastolic heart failure, COPD, history of lung and colon cancers, moderate nonobstructive CAD, and hypertension.  He also complains of dyspnea on exertion which is chronic. I saw him back in September which time his blood pressure was elevated and I increased bisoprolol to 10 mg twice a day. His blood pressures been well-controlled on this. He denies any chest pain, palpitations, dizziness, or presyncope.  He also had a PET scan in his followed by Dr. Roxan Hockey. There was a question of a recurrence of lung cancer but it looks like it may be atelectasis and he will have a followup CT in 3 months.  No Known Allergies  Current Outpatient Prescriptions on File Prior to Visit: albuterol (VENTOLIN HFA) 108 (90 BASE) MCG/ACT inhaler, Inhale 2 puffs into the lungs every 6 (six) hours as needed.  , Disp: , Rfl:  ALPRAZolam (XANAX) 0.5 MG tablet, 0.5 mg 3 (three) times daily as needed. , Disp: , Rfl:  amiodarone (PACERONE) 200 MG tablet, TAKE 1/2 A TABLET BY MOUTH EVERY DAY, Disp: 45 tablet, Rfl: 2 aspirin 81 MG tablet, Take 81 mg by mouth daily., Disp: , Rfl:  FLUoxetine (PROZAC) 20 MG tablet, Take 3 by mouth daily , Disp: , Rfl:  Fluticasone-Salmeterol (ADVAIR DISKUS) 250-50 MCG/DOSE AEPB, Inhale 1 puff into the lungs every 12 (twelve) hours.  , Disp: , Rfl:  furosemide (LASIX) 40 MG tablet, Take 1 in the morning and 1/2 in the afternoon, Disp: 45 tablet, Rfl: 6 HYDROcodone-acetaminophen (NORCO) 5-325 MG per tablet, Take 1 tablet by mouth every 6 (six) hours as needed.  , Disp: , Rfl:  metFORMIN (GLUCOPHAGE) 500 MG tablet, Take by mouth 2 (two) times daily with a meal., Disp: , Rfl:  Multiple Vitamin (MULTIVITAMIN) tablet, Take 1 tablet by mouth daily.  , Disp: , Rfl:  potassium chloride (KLOR-CON 10)  10 MEQ tablet, TAKE 1 TABLET BY MOUTH EVERY DAY, Disp: 30 tablet, Rfl: 4  No current facility-administered medications on file prior to visit.   Past Medical History:   Adenocarcinoma, lung                            2008         Adenocarcinoma of sigmoid colon                 2008         COPD (chronic obstructive pulmonary disease)                   Comment:PFTs 07/11>>FEV1 50%, FEV1% 54, TLC 80%, DLCO               69%   OSA (obstructive sleep apnea)                                  Comment:PSG 06/29/10>>AHI 61.4   Coronary artery disease                                      Hypertension  Diastolic congestive heart failure                           Atrial fibrillation                                            Comment:Chronic amiodarone therapy   Osteoarthritis                                               Chronic kidney disease                                       Pneumonia                                       June 2011    Depression                                                   Low testosterone                                             Emphysema of lung                                            Lung cancer                                                  Basal cell cancer                                            CHF (congestive heart failure)                               Visual disturbance                                           Hearing loss                                                Past Surgical History:   LEFT UPPER LOBE WEDGE RESECTION  2008         TOTAL HIP ARTHROPLASTY                                          Comment:Bilateral   REPLACEMENT TOTAL KNEE BILATERAL                              LOW ANTERIOR BOWEL RESECTION                     2008         CATARACT EXTRACTION W/ INTRAOCULAR LENS  IMPLA*               COLONOSCOPY                                                   COLON SURGERY                                                  skin caner                                                    LUNG CANCER SURGERY                                          Review of patient's family history indicates:   Colon cancer                   Father                   Cancer                         Father                     Comment: colon   Colon cancer                   Sister                   Cancer                         Sister                     Comment: colon   Asthma                         Sister                   Diabetes                       Sister  Social History   Marital Status: Widowed             Spouse Name:                      Years of Education:                 Number of children:             Occupational History Occupation          Loss adjuster, [employer]                               Social History Main Topics   Smoking Status: Former Smoker                   Packs/Day: 2.00  Years: 33        Types: Cigarettes     Quit date: 05/14/2006   Smokeless Status: Never Used                       Comment: 1-3 packs a day   Alcohol Use: No                Comment: Quit 2008   Drug Use: No             Sexual Activity: Not on file        Other Topics            Concern   None on file  Social History Narrative   None on file    ROS: See history of present illness otherwise negative   PHYSICAL EXAM: Well-nournished, in no acute distress. Neck: No JVD, HJR, Bruit, or thyroid enlargement  Lungs: Decreased breath sounds No tachypnea, clear without wheezing, rales, or rhonchi  Cardiovascular: RRR, PMI not displaced, heart sounds distant, positive S4, no murmurs, bruit, thrill, or heave.  Abdomen: BS normal. Soft without organomegaly, masses, lesions or tenderness.  Extremities: without cyanosis, clubbing or edema. Good distal pulses bilateral  SKin: Warm, no lesions or rashes   Musculoskeletal: No  deformities  Neuro: no focal signs  BP 119/67  Pulse 64  Ht 6' (1.829 m)  Wt 230 lb 1.9 oz (104.382 kg)  BMI 31.20 kg/m2   EKG: Normal sinus rhythm, normal EKG

## 2013-10-12 NOTE — Patient Instructions (Signed)
Your physician recommends that you schedule a follow-up appointment in: [place] physician recommends that you continue on your current medications as directed. Please refer to the Current Medication list given to you today.  WE FILLED YOUR MEDICATIONS TODAY

## 2013-10-19 ENCOUNTER — Other Ambulatory Visit: Payer: Self-pay

## 2013-10-19 MED ORDER — POTASSIUM CHLORIDE ER 10 MEQ PO TBCR: 10.0000 meq | EXTENDED_RELEASE_TABLET | Freq: Every day | ORAL | Status: DC

## 2013-11-30 ENCOUNTER — Other Ambulatory Visit: Payer: Self-pay

## 2013-11-30 DIAGNOSIS — D381 Neoplasm of uncertain behavior of trachea, bronchus and lung: Secondary | ICD-10-CM

## 2013-12-11 ENCOUNTER — Inpatient Hospital Stay (HOSPITAL_COMMUNITY)
Admission: EM | Admit: 2013-12-11 | Discharge: 2013-12-14 | DRG: 177 | Disposition: A | Discharge status: Home / Self Care | Payer: Medicare HMO | Source: Other Acute Inpatient Hospital | Attending: Internal Medicine | Admitting: Internal Medicine

## 2013-12-11 DIAGNOSIS — R0989 Other specified symptoms and signs involving the circulatory and respiratory systems: Secondary | ICD-10-CM

## 2013-12-11 DIAGNOSIS — J9601 Acute respiratory failure with hypoxia: Secondary | ICD-10-CM

## 2013-12-11 DIAGNOSIS — Z87891 Personal history of nicotine dependence: Secondary | ICD-10-CM | POA: Diagnosis not present

## 2013-12-11 DIAGNOSIS — J9600 Acute respiratory failure, unspecified whether with hypoxia or hypercapnia: Secondary | ICD-10-CM | POA: Diagnosis present

## 2013-12-11 DIAGNOSIS — Z96649 Presence of unspecified artificial hip joint: Secondary | ICD-10-CM | POA: Diagnosis not present

## 2013-12-11 DIAGNOSIS — I509 Heart failure, unspecified: Secondary | ICD-10-CM | POA: Diagnosis present

## 2013-12-11 DIAGNOSIS — G4733 Obstructive sleep apnea (adult) (pediatric): Secondary | ICD-10-CM

## 2013-12-11 DIAGNOSIS — F32A Depression, unspecified: Secondary | ICD-10-CM

## 2013-12-11 DIAGNOSIS — R0609 Other forms of dyspnea: Secondary | ICD-10-CM

## 2013-12-11 DIAGNOSIS — I5032 Chronic diastolic (congestive) heart failure: Secondary | ICD-10-CM

## 2013-12-11 DIAGNOSIS — E119 Type 2 diabetes mellitus without complications: Secondary | ICD-10-CM | POA: Diagnosis present

## 2013-12-11 DIAGNOSIS — Z902 Acquired absence of lung [part of]: Secondary | ICD-10-CM | POA: Diagnosis not present

## 2013-12-11 DIAGNOSIS — Z860100 Personal history of colon polyps, unspecified: Secondary | ICD-10-CM

## 2013-12-11 DIAGNOSIS — J690 Pneumonitis due to inhalation of food and vomit: Secondary | ICD-10-CM | POA: Diagnosis present

## 2013-12-11 DIAGNOSIS — IMO0001 Reserved for inherently not codable concepts without codable children: Secondary | ICD-10-CM

## 2013-12-11 DIAGNOSIS — R222 Localized swelling, mass and lump, trunk: Secondary | ICD-10-CM

## 2013-12-11 DIAGNOSIS — Z800 Family history of malignant neoplasm of digestive organs: Secondary | ICD-10-CM

## 2013-12-11 DIAGNOSIS — F329 Major depressive disorder, single episode, unspecified: Secondary | ICD-10-CM | POA: Diagnosis present

## 2013-12-11 DIAGNOSIS — I4891 Unspecified atrial fibrillation: Secondary | ICD-10-CM | POA: Diagnosis present

## 2013-12-11 DIAGNOSIS — I129 Hypertensive chronic kidney disease with stage 1 through stage 4 chronic kidney disease, or unspecified chronic kidney disease: Secondary | ICD-10-CM | POA: Diagnosis present

## 2013-12-11 DIAGNOSIS — Z96659 Presence of unspecified artificial knee joint: Secondary | ICD-10-CM | POA: Diagnosis not present

## 2013-12-11 DIAGNOSIS — Z7982 Long term (current) use of aspirin: Secondary | ICD-10-CM | POA: Diagnosis not present

## 2013-12-11 DIAGNOSIS — Z85038 Personal history of other malignant neoplasm of large intestine: Secondary | ICD-10-CM

## 2013-12-11 DIAGNOSIS — Z85118 Personal history of other malignant neoplasm of bronchus and lung: Secondary | ICD-10-CM | POA: Diagnosis not present

## 2013-12-11 DIAGNOSIS — M1990 Unspecified osteoarthritis, unspecified site: Secondary | ICD-10-CM | POA: Diagnosis present

## 2013-12-11 DIAGNOSIS — Z85828 Personal history of other malignant neoplasm of skin: Secondary | ICD-10-CM | POA: Diagnosis not present

## 2013-12-11 DIAGNOSIS — E669 Obesity, unspecified: Secondary | ICD-10-CM | POA: Diagnosis present

## 2013-12-11 DIAGNOSIS — F3289 Other specified depressive episodes: Secondary | ICD-10-CM | POA: Diagnosis present

## 2013-12-11 DIAGNOSIS — I2510 Atherosclerotic heart disease of native coronary artery without angina pectoris: Secondary | ICD-10-CM | POA: Diagnosis present

## 2013-12-11 DIAGNOSIS — Z8679 Personal history of other diseases of the circulatory system: Secondary | ICD-10-CM

## 2013-12-11 DIAGNOSIS — Z6832 Body mass index (BMI) 32.0-32.9, adult: Secondary | ICD-10-CM | POA: Diagnosis not present

## 2013-12-11 DIAGNOSIS — N189 Chronic kidney disease, unspecified: Secondary | ICD-10-CM | POA: Diagnosis present

## 2013-12-11 DIAGNOSIS — J438 Other emphysema: Secondary | ICD-10-CM | POA: Diagnosis present

## 2013-12-11 DIAGNOSIS — I10 Essential (primary) hypertension: Secondary | ICD-10-CM

## 2013-12-11 LAB — GLUCOSE, CAPILLARY: GLUCOSE-CAPILLARY: 162 mg/dL — AB (ref 70–99)

## 2013-12-11 MED ORDER — BISOPROLOL FUMARATE 10 MG PO TABS
10.0000 mg | ORAL_TABLET | Freq: Two times a day (BID) | ORAL | Status: DC
  Administered 2013-12-12 – 2013-12-14 (×6): 10 mg via ORAL
  Filled 2013-12-11 (×7): qty 1

## 2013-12-11 MED ORDER — ATORVASTATIN CALCIUM 20 MG PO TABS
20.0000 mg | ORAL_TABLET | Freq: Every day | ORAL | Status: DC
  Administered 2013-12-12 – 2013-12-13 (×2): 20 mg via ORAL
  Filled 2013-12-11 (×3): qty 1

## 2013-12-11 MED ORDER — GABAPENTIN 300 MG PO CAPS
300.0000 mg | ORAL_CAPSULE | Freq: Three times a day (TID) | ORAL | Status: DC
  Administered 2013-12-12 – 2013-12-14 (×8): 300 mg via ORAL
  Filled 2013-12-11 (×10): qty 1

## 2013-12-11 MED ORDER — AMIODARONE HCL 100 MG PO TABS
100.0000 mg | ORAL_TABLET | Freq: Every day | ORAL | Status: DC
  Administered 2013-12-12 – 2013-12-14 (×3): 100 mg via ORAL
  Filled 2013-12-11 (×3): qty 1

## 2013-12-11 MED ORDER — FLUOXETINE HCL 20 MG PO TABS
20.0000 mg | ORAL_TABLET | Freq: Every day | ORAL | Status: DC
  Administered 2013-12-12 – 2013-12-14 (×3): 20 mg via ORAL
  Filled 2013-12-11 (×3): qty 1

## 2013-12-11 MED ORDER — MOMETASONE FURO-FORMOTEROL FUM 100-5 MCG/ACT IN AERO
2.0000 | INHALATION_SPRAY | Freq: Two times a day (BID) | RESPIRATORY_TRACT | Status: DC
  Administered 2013-12-12 – 2013-12-14 (×5): 2 via RESPIRATORY_TRACT
  Filled 2013-12-11: qty 8.8

## 2013-12-12 ENCOUNTER — Inpatient Hospital Stay (HOSPITAL_COMMUNITY): Payer: Medicare HMO

## 2013-12-12 ENCOUNTER — Encounter (HOSPITAL_COMMUNITY): Payer: Self-pay | Admitting: *Deleted

## 2013-12-12 DIAGNOSIS — J9600 Acute respiratory failure, unspecified whether with hypoxia or hypercapnia: Secondary | ICD-10-CM

## 2013-12-12 DIAGNOSIS — J9601 Acute respiratory failure with hypoxia: Secondary | ICD-10-CM | POA: Diagnosis present

## 2013-12-12 DIAGNOSIS — J690 Pneumonitis due to inhalation of food and vomit: Secondary | ICD-10-CM

## 2013-12-12 LAB — COMPREHENSIVE METABOLIC PANEL
ALBUMIN: 3.5 g/dL (ref 3.5–5.2)
ALT: 17 U/L (ref 0–53)
AST: 15 U/L (ref 0–37)
Alkaline Phosphatase: 96 U/L (ref 39–117)
Anion gap: 17 — ABNORMAL HIGH (ref 5–15)
BUN: 27 mg/dL — ABNORMAL HIGH (ref 6–23)
CALCIUM: 8.8 mg/dL (ref 8.4–10.5)
CHLORIDE: 100 meq/L (ref 96–112)
CO2: 24 mEq/L (ref 19–32)
Creatinine, Ser: 1.54 mg/dL — ABNORMAL HIGH (ref 0.50–1.35)
GFR calc Af Amer: 51 mL/min — ABNORMAL LOW (ref >90)
GFR calc non Af Amer: 44 mL/min — ABNORMAL LOW (ref >90)
Glucose, Bld: 165 mg/dL — ABNORMAL HIGH (ref 70–99)
Potassium: 3.8 mEq/L (ref 3.7–5.3)
Sodium: 141 mEq/L (ref 137–147)
Total Bilirubin: 0.5 mg/dL (ref 0.3–1.2)
Total Protein: 6.1 g/dL (ref 6.0–8.3)

## 2013-12-12 LAB — LACTIC ACID, PLASMA: Lactic Acid, Venous: 2.1 mmol/L (ref 0.5–2.2)

## 2013-12-12 LAB — GLUCOSE, CAPILLARY
GLUCOSE-CAPILLARY: 128 mg/dL — AB (ref 70–99)
GLUCOSE-CAPILLARY: 98 mg/dL (ref 70–99)
GLUCOSE-CAPILLARY: 73 mg/dL (ref 70–99)
GLUCOSE-CAPILLARY: 92 mg/dL (ref 70–99)
GLUCOSE-CAPILLARY: 116 mg/dL — AB (ref 70–99)
Glucose-Capillary: 111 mg/dL — ABNORMAL HIGH (ref 70–99)
Glucose-Capillary: 113 mg/dL — ABNORMAL HIGH (ref 70–99)
Glucose-Capillary: 122 mg/dL — ABNORMAL HIGH (ref 70–99)
Glucose-Capillary: 124 mg/dL — ABNORMAL HIGH (ref 70–99)
Glucose-Capillary: 145 mg/dL — ABNORMAL HIGH (ref 70–99)

## 2013-12-12 LAB — CBC WITH DIFFERENTIAL/PLATELET
BASOS PCT: 0 % (ref 0–1)
Basophils Absolute: 0 10*3/uL (ref 0.0–0.1)
Eosinophils Absolute: 0 10*3/uL (ref 0.0–0.7)
Eosinophils Relative: 0 % (ref 0–5)
HCT: 35.2 % — ABNORMAL LOW (ref 39.0–52.0)
Hemoglobin: 11.4 g/dL — ABNORMAL LOW (ref 13.0–17.0)
Lymphocytes Relative: 3 % — ABNORMAL LOW (ref 12–46)
Lymphs Abs: 0.4 10*3/uL — ABNORMAL LOW (ref 0.7–4.0)
MCH: 30.2 pg (ref 26.0–34.0)
MCHC: 32.4 g/dL (ref 30.0–36.0)
MCV: 93.1 fL (ref 78.0–100.0)
Monocytes Absolute: 1 10*3/uL (ref 0.1–1.0)
Monocytes Relative: 8 % (ref 3–12)
NEUTROS PCT: 89 % — AB (ref 43–77)
Neutro Abs: 10.9 10*3/uL — ABNORMAL HIGH (ref 1.7–7.7)
PLATELETS: 205 10*3/uL (ref 150–400)
RBC: 3.78 MIL/uL — ABNORMAL LOW (ref 4.22–5.81)
RDW: 14.1 % (ref 11.5–15.5)
WBC: 12.4 10*3/uL — ABNORMAL HIGH (ref 4.0–10.5)

## 2013-12-12 LAB — URINALYSIS, ROUTINE W REFLEX MICROSCOPIC
BILIRUBIN URINE: NEGATIVE
Glucose, UA: NEGATIVE mg/dL
Hgb urine dipstick: NEGATIVE
Ketones, ur: NEGATIVE mg/dL
Leukocytes, UA: NEGATIVE
Nitrite: NEGATIVE
Protein, ur: 30 mg/dL — AB
SPECIFIC GRAVITY, URINE: 1.019 (ref 1.005–1.030)
UROBILINOGEN UA: 0.2 mg/dL (ref 0.0–1.0)
pH: 5 (ref 5.0–8.0)

## 2013-12-12 LAB — URINE MICROSCOPIC-ADD ON

## 2013-12-12 LAB — MRSA PCR SCREENING: MRSA BY PCR: NEGATIVE

## 2013-12-12 LAB — PRO B NATRIURETIC PEPTIDE: PRO B NATRI PEPTIDE: 2384 pg/mL — AB (ref 0–125)

## 2013-12-12 LAB — MAGNESIUM: MAGNESIUM: 1.6 mg/dL (ref 1.5–2.5)

## 2013-12-12 LAB — PHOSPHORUS: Phosphorus: 3.4 mg/dL (ref 2.3–4.6)

## 2013-12-12 LAB — STREP PNEUMONIAE URINARY ANTIGEN: STREP PNEUMO URINARY ANTIGEN: NEGATIVE

## 2013-12-12 LAB — PROCALCITONIN: PROCALCITONIN: 10.37 ng/mL

## 2013-12-12 MED ORDER — FAMOTIDINE 20 MG PO TABS
20.0000 mg | ORAL_TABLET | Freq: Two times a day (BID) | ORAL | Status: DC
  Administered 2013-12-12 – 2013-12-14 (×5): 20 mg via ORAL
  Filled 2013-12-12 (×6): qty 1

## 2013-12-12 MED ORDER — SODIUM CHLORIDE 0.9 % IV SOLN
250.0000 mL | INTRAVENOUS | Status: DC | PRN
  Administered 2013-12-12: 250 mL via INTRAVENOUS

## 2013-12-12 MED ORDER — ASPIRIN 81 MG PO CHEW
324.0000 mg | CHEWABLE_TABLET | ORAL | Status: AC
  Administered 2013-12-12: 324 mg via ORAL
  Filled 2013-12-12 (×2): qty 4

## 2013-12-12 MED ORDER — IPRATROPIUM BROMIDE 0.02 % IN SOLN
0.5000 mg | RESPIRATORY_TRACT | Status: DC
  Administered 2013-12-12 (×3): 0.5 mg via RESPIRATORY_TRACT
  Filled 2013-12-12 (×3): qty 2.5

## 2013-12-12 MED ORDER — HEPARIN SODIUM (PORCINE) 5000 UNIT/ML IJ SOLN
5000.0000 [IU] | Freq: Three times a day (TID) | INTRAMUSCULAR | Status: DC
  Administered 2013-12-12 – 2013-12-14 (×8): 5000 [IU] via SUBCUTANEOUS
  Filled 2013-12-12 (×12): qty 1

## 2013-12-12 MED ORDER — ASPIRIN 300 MG RE SUPP: 300.0000 mg | RECTAL | Status: AC

## 2013-12-12 MED ORDER — ONDANSETRON HCL 4 MG/2ML IJ SOLN: 4.0000 mg | Freq: Four times a day (QID) | INTRAMUSCULAR | Status: DC | PRN

## 2013-12-12 MED ORDER — IPRATROPIUM-ALBUTEROL 0.5-2.5 (3) MG/3ML IN SOLN
3.0000 mL | RESPIRATORY_TRACT | Status: DC
  Administered 2013-12-12 – 2013-12-13 (×4): 3 mL via RESPIRATORY_TRACT
  Filled 2013-12-12 (×4): qty 3

## 2013-12-12 MED ORDER — PIPERACILLIN-TAZOBACTAM 3.375 G IVPB
3.3750 g | Freq: Three times a day (TID) | INTRAVENOUS | Status: DC
  Administered 2013-12-12 – 2013-12-14 (×8): 3.375 g via INTRAVENOUS
  Filled 2013-12-12 (×10): qty 50

## 2013-12-12 MED ORDER — CETYLPYRIDINIUM CHLORIDE 0.05 % MT LIQD: 7.0000 mL | Freq: Two times a day (BID) | OROMUCOSAL | Status: DC

## 2013-12-12 MED ORDER — ALBUTEROL SULFATE (2.5 MG/3ML) 0.083% IN NEBU
2.5000 mg | INHALATION_SOLUTION | RESPIRATORY_TRACT | Status: DC
Start: 2013-12-12 — End: 2013-12-12
  Administered 2013-12-12 (×3): 2.5 mg via RESPIRATORY_TRACT
  Filled 2013-12-12 (×3): qty 3

## 2013-12-12 MED ORDER — FAMOTIDINE IN NACL 20-0.9 MG/50ML-% IV SOLN
20.0000 mg | Freq: Two times a day (BID) | INTRAVENOUS | Status: DC
  Administered 2013-12-12: 20 mg via INTRAVENOUS
  Filled 2013-12-12 (×3): qty 50

## 2013-12-12 MED ORDER — INSULIN ASPART 100 UNIT/ML ~~LOC~~ SOLN
2.0000 [IU] | SUBCUTANEOUS | Status: DC
  Administered 2013-12-12 – 2013-12-13 (×4): 2 [IU] via SUBCUTANEOUS

## 2013-12-12 NOTE — Progress Notes (Signed)
Placed patient on CPAP via nasal mask, home settings of 11.0 cm H20 per patient with 2 lpm O2 bleed in.  Patient tolerating well at this time.  RN aware.

## 2013-12-12 NOTE — Progress Notes (Signed)
73yo male tx'd from [place] for CHF vs COPD given severe respiratory distress, vomited after neb w/ concern for aspiration, to begin IV ABX.  Rec'd cefepime, Levaquin prior to transfer.  Labs from OSH: WBC 13.5, Hgb 13.7, Plt 235, trop 0.02, K 3.7, SCr 1.69.  Will start Zosyn 3.375g IV Q8H for CrCl ~50 ml/min and monitor CBC and Cx.  Wynona Neat, PharmD, BCPS 12/12/2013 12:45 AM

## 2013-12-12 NOTE — H&P (Signed)
PULMONARY / CRITICAL CARE MEDICINE   Name: Jonathan [name]. MRN: [id number] DOB: [birth year]    ADMISSION DATE:  12/11/2013 CONSULTATION DATE:  12/11/2013   REFERRING MD :  Transfer from [place]:  Shortness of breath  INITIAL PRESENTATION: To [name] via EMS with acute hypoxic respiratory failure and emesis.  STUDIES:  8/1 - CXR - Bibasilar opacities  SIGNIFICANT EVENTS: 7/31 - Initiated BiPap   HISTORY OF PRESENT ILLNESS:  [name] is a 73 y/o man with a history of CHF, COPD, lung cancer s/p resection, DMII, a-fib on amiodarone, OSA on home CPAP, CKD who presented to the [name] ER today via EMS with severe shortness of breath.  He reports that he has been short of breath for the last 3 days and that it got acutely worse today after he had a few episodes of emesis.  He denies cough, fever, diarrhea or nausea prior to his emesis. He does not recall aspirating.  Upon EMS arrival he was found to have an O2 sat of 78% and was placed on a non-rebreather.  In the [facility] ED he requested transfer to [name]. There he was started on BiPap with significant improvement in his O2 sats.  Blood cultures were drawn there and he was given a dose of levaquin and may have gotten a dose of cefepime although it is not completely clear from the ER records.  He denies every having to be treated with BiPap for respiratory distress previously however he does wear CPAP for OSA.    PAST MEDICAL HISTORY :  Past Medical History  Diagnosis Date  . Adenocarcinoma, lung 2008  . Adenocarcinoma of sigmoid colon 2008  . COPD (chronic obstructive pulmonary disease)     PFTs 07/11>>FEV1 50%, FEV1% 54, TLC 80%, DLCO 69%  . OSA (obstructive sleep apnea)     PSG 06/29/10>>AHI 61.4  . Coronary artery disease   . Hypertension   . Diastolic congestive heart failure   . Atrial fibrillation     Chronic amiodarone therapy  . Osteoarthritis   . Chronic kidney disease   . Pneumonia June 2011   . Depression   . Low testosterone   . Emphysema of lung   . Lung cancer   . Basal cell cancer   . CHF (congestive heart failure)   . Visual disturbance   . Hearing loss    Past Surgical History  Procedure Laterality Date  . Left upper lobe wedge resection  2008  . Total hip arthroplasty      Bilateral  . Replacement total knee bilateral    . Low anterior bowel resection  2008  . Cataract extraction w/ intraocular lens  implant, bilateral    . Colonoscopy    . Colon surgery    . Skin caner    . Lung cancer surgery     Prior to Admission medications   Medication Sig Start Date End Date Taking? Authorizing Provider  albuterol (VENTOLIN HFA) 108 (90 BASE) MCG/ACT inhaler Inhale 2 puffs into the lungs every 6 (six) hours as needed.      Historical Provider, MD  ALPRAZolam Duanne Moron) 0.5 MG tablet 0.5 mg 3 (three) times daily as needed.  03/12/13   Historical Provider, MD  amiodarone (PACERONE) 200 MG tablet TAKE 1/2 A TABLET BY MOUTH EVERY DAY 10/12/13   [name], PA-C  aspirin 81 MG tablet Take 81 mg by mouth daily.    Historical Provider, MD  atorvastatin (LIPITOR)  20 MG tablet  09/28/13   Historical Provider, MD  bisoprolol (ZEBETA) 10 MG tablet Take 10 mg by mouth 2 (two) times daily.    Historical Provider, MD  FLUoxetine (PROZAC) 20 MG tablet Take 3 by mouth daily     Historical Provider, MD  Fluticasone-Salmeterol (ADVAIR DISKUS) 250-50 MCG/DOSE AEPB Inhale 1 puff into the lungs every 12 (twelve) hours.      Historical Provider, MD  furosemide (LASIX) 40 MG tablet Take 1 in the morning and 1/2 in the afternoon 12/05/11   Larey Dresser, MD  gabapentin (NEURONTIN) 300 MG capsule Take 300 mg by mouth 3 (three) times daily.    Historical Provider, MD  glimepiride (AMARYL) 2 MG tablet Take 2 mg by mouth daily with breakfast.    Historical Provider, MD  HYDROcodone-acetaminophen (NORCO) 5-325 MG per tablet Take 1 tablet by mouth every 6 (six) hours as needed.      Historical  Provider, MD  metFORMIN (GLUCOPHAGE) 500 MG tablet Take by mouth 2 (two) times daily with a meal.    Historical Provider, MD  Multiple Vitamin (MULTIVITAMIN) tablet Take 1 tablet by mouth daily.      Historical Provider, MD  potassium chloride (KLOR-CON 10) 10 MEQ tablet Take 1 tablet (10 mEq total) by mouth daily. 10/19/13   Larey Dresser, MD   No Known Allergies  FAMILY HISTORY:  Family History  Problem Relation Age of Onset  . Colon cancer Father   . Cancer Father     colon  . Colon cancer Sister   . Cancer Sister     colon  . Asthma Sister   . Diabetes Sister    SOCIAL HISTORY:  reports that he quit smoking about 7 years ago. His smoking use included Cigarettes. He has a 70 pack-year smoking history. He has never used smokeless tobacco. He reports that he does not drink alcohol or use illicit drugs.  REVIEW OF SYSTEMS:  A review of 14 systems was negative except as stated in the HPI.     VITAL SIGNS: Pulse Rate:  [70] 70 (08/01 0001) Resp:  [18] 18 (08/01 0001) SpO2:  [99 %] 99 % (08/01 0001) FiO2 (%):  [50 %] 50 % (08/01 0001) HEMODYNAMICS:   VENTILATOR SETTINGS: Vent Mode:  [-]  FiO2 (%):  [50 %] 50 % INTAKE / OUTPUT: No intake or output data in the 24 hours ending 12/12/13 0024  PHYSICAL EXAMINATION: General:  Obese male laying in bed, BiPap mask in place.  Speaking in full sentences with mask on  Neuro:  A&Ox3 HEENT:  PERRL, EOMI, OP clear Cardiovascular:  NRRR, no mrg  Lungs:  Diminished in right lung base, rhonchi in bilateral upper lobes Abdomen:  Obese, +BS, NTND, no appreciable HSM Musculoskeletal:  1+edema bilateral lower extremities Skin:  No bruises, rashes or other lesions  LABS: - pending  CBC No results found for this basename: WBC, HGB, HCT, PLT,  in the last 168 hours Coag's No results found for this basename: APTT, INR,  in the last 168 hours BMET No results found for this basename: NA, K, CL, CO2, BUN, CREATININE, GLUCOSE,  in the last  168 hours Electrolytes No results found for this basename: CALCIUM, MG, PHOS,  in the last 168 hours Sepsis Markers No results found for this basename: LATICACIDVEN, PROCALCITON, O2SATVEN,  in the last 168 hours ABG No results found for this basename: PHART, PCO2ART, PO2ART,  in the last 168 hours Liver Enzymes No results  found for this basename: AST, ALT, ALKPHOS, BILITOT, ALBUMIN,  in the last 168 hours Cardiac Enzymes No results found for this basename: TROPONINI, PROBNP,  in the last 168 hours Glucose  Recent Labs Lab 12/11/13 2323  GLUCAP 162*    Imaging No results found. CXR - bilateral basilar opacities R>L, official read pending  ASSESSMENT / PLAN:  PULMONARY  A:Acute hypoxic respiratory failure, COPD, likely aspiration pneumonitis P:   Currently improved on BiPap VBG pending Wean BiPap as able - will need CPAP with sleep 2/2 OSA Will treat with zosyn for possible aspiration pneumonia Continue LABA/ICS combo, albuterol/atrovent nebsq4   CARDIOVASCULAR A: CHF, a-fib P:  Continue home amiodarone Hold on lasix tonight, will reassess volume status in AM  RENAL A:  Hx of CKD P:   Monitor UOP and Cr on daily BMP  GASTROINTESTINAL A:  Emesis, nausea P:   Zofran PRN   INFECTIOUS A:  Aspiration pneumonitis vs. pneumonia P:   BCx2 pending from OSH UC pending Abx: Zosyn, start date 8/1, day 1/7  ENDOCRINE A:  DMII   P:   Glucose stabilizer    TODAY'S SUMMARY: Acute hypoxic respiratory failure likely secondary to aspiration.   I have personally obtained a history, examined the patient, evaluated laboratory and imaging results, formulated the assessment and plan and placed orders. CRITICAL CARE: The patient is critically ill with multiple organ systems failure and requires high complexity decision making for assessment and support, frequent evaluation and titration of therapies, application of advanced monitoring technologies and extensive  interpretation of multiple databases. Critical Care Time devoted to patient care services described in this note is 40 minutes.    Pulmonary and [place] Pager: [phone]  12/12/2013, 12:24 AM

## 2013-12-12 NOTE — Progress Notes (Signed)
PULMONARY / CRITICAL CARE MEDICINE   Name: [name] Sr. MRN: [id number] DOB: 02-27-1941    ADMISSION DATE:  12/11/2013 CONSULTATION DATE:  12/11/2013   REFERRING MD :  Transfer from [place]:  Shortness of breath  INITIAL PRESENTATION: To [name] via EMS with acute hypoxic respiratory failure and emesis.  STUDIES:  8/1 - CXR - Bibasilar opacities  SIGNIFICANT EVENTS: 7/31 - Initiated BiPap   HISTORY OF PRESENT ILLNESS:  Mr. Kathol is a 73 y/o man with a history of CHF, COPD, lung cancer s/p resection, DMII, a-fib on amiodarone, OSA on home CPAP, CKD who presented to the [facility] ER today via EMS with severe shortness of breath.  He reports that he has been short of breath for the last 3 days and that it got acutely worse today after he had a few episodes of emesis.  He denies cough, fever, diarrhea or nausea prior to his emesis. He does not recall aspirating.  Upon EMS arrival he was found to have an O2 sat of 78% and was placed on a non-rebreather.  In the [facility] ED he requested transfer to [facility]. There he was started on BiPap with significant improvement in his O2 sats.  Blood cultures were drawn there and he was given a dose of levaquin and may have gotten a dose of cefepime although it is not completely clear from the ER records.  He denies every having to be treated with BiPap for respiratory distress previously however he does wear CPAP for OSA.      VITAL SIGNS: Temp:  [98.8 F (37.1 C)-101.6 F (38.7 C)] 98.8 F (37.1 C) (08/01 0445) Pulse Rate:  [61-74] 61 (08/01 0600) Resp:  [10-23] 23 (08/01 0600) BP: (105-122)/(54-67) 122/61 mmHg (08/01 0600) SpO2:  [96 %-100 %] 98 % (08/01 0600) FiO2 (%):  [50 %] 50 % (08/01 0035) Weight:  [104.7 kg (230 lb 13.2 oz)-107.4 kg (236 lb 12.4 oz)] 107.4 kg (236 lb 12.4 oz) (08/01 0419) HEMODYNAMICS:   VENTILATOR SETTINGS: Vent Mode:  [-]  FiO2 (%):  [50 %] 50 % INTAKE / OUTPUT:  Intake/Output Summary  (Last 24 hours) at 12/12/13 0748 Last data filed at 12/12/13 0700  Gross per 24 hour  Intake    280 ml  Output    550 ml  Net   -270 ml    PHYSICAL EXAMINATION: General:  Obese male laying in bed, on [place]  Speaking in full sentences  Neuro:  A&Ox3 HEENT:  PERRL, EOMI, OP clear Cardiovascular:  NRRR, no mrg  Lungs:  Diminished in right lung base, rhonchi in bilateral upper lobes Abdomen:  Obese, +BS, NTND, no appreciable HSM Musculoskeletal:  1+edema bilateral lower extremities Skin:  No bruises, rashes or other lesions  LABS: - pending  CBC  Recent Labs Lab 12/12/13 0030  WBC 12.4*  HGB 11.4*  HCT 35.2*  PLT 205   Coag's No results found for this basename: APTT, INR,  in the last 168 hours BMET  Recent Labs Lab 12/12/13 0030  NA 141  K 3.8  CL 100  CO2 24  BUN 27*  CREATININE 1.54*  GLUCOSE 165*   Electrolytes  Recent Labs Lab 12/12/13 0030  CALCIUM 8.8  MG 1.6  PHOS 3.4   Sepsis Markers  Recent Labs Lab 12/12/13 0030  LATICACIDVEN 2.1  PROCALCITON 10.37   ABG No results found for this basename: PHART, PCO2ART, PO2ART,  in the last 168 hours Liver Enzymes  Recent  Labs Lab 12/12/13 0030  AST 15  ALT 17  ALKPHOS 96  BILITOT 0.5  ALBUMIN 3.5   Cardiac Enzymes  Recent Labs Lab 12/12/13 0048  PROBNP 2384.0*   Glucose  Recent Labs Lab 12/11/13 2323 12/12/13 0447  GLUCAP 162* 128*    Imaging No results found. CXR - bilateral basilar opacities R>L, official read pending  ASSESSMENT / PLAN:  PULMONARY  A:Acute hypoxic respiratory failure, COPD, likely aspiration pneumonitis P:   dc BiPap  - will need CPAP with sleep 2/2 OSA Will treat with zosyn for possible aspiration pneumonia Continue LABA/ICS combo, albuterol/atrovent nebsq4   CARDIOVASCULAR A: CHF, a-fib P:  Continue home amiodarone Hold on lasix , reassess volume status in AM  RENAL A:  Hx of CKD P:   Monitor UOP and Cr on daily BMP  GASTROINTESTINAL A:   Emesis, nausea P:   Zofran PRN   INFECTIOUS A:  Aspiration pneumonitis vs. CAP Urine strep ag neg P:   BCx2 pending from OSH UC pending Abx: Zosyn, start date 8/1, day 1/7  ENDOCRINE A:  DMII   P:   SSI    TODAY'S SUMMARY: Acute hypoxic respiratory failure likely secondary to aspiration vs RLL CAP.  Off bipap , can transfer to tele  I have personally obtained a history, examined the patient, evaluated laboratory and imaging results, formulated the assessment and plan and placed orders.   Kara Mead MD. Shade Flood. [place] [facility] Pager [phone] If no response call [phone]    12/12/2013, 7:48 AM

## 2013-12-13 ENCOUNTER — Inpatient Hospital Stay (HOSPITAL_COMMUNITY): Payer: Medicare HMO

## 2013-12-13 DIAGNOSIS — J4489 Other specified chronic obstructive pulmonary disease: Secondary | ICD-10-CM

## 2013-12-13 DIAGNOSIS — J449 Chronic obstructive pulmonary disease, unspecified: Secondary | ICD-10-CM

## 2013-12-13 DIAGNOSIS — J690 Pneumonitis due to inhalation of food and vomit: Principal | ICD-10-CM

## 2013-12-13 DIAGNOSIS — J9600 Acute respiratory failure, unspecified whether with hypoxia or hypercapnia: Secondary | ICD-10-CM

## 2013-12-13 LAB — BASIC METABOLIC PANEL
Anion gap: 12 (ref 5–15)
BUN: 25 mg/dL — ABNORMAL HIGH (ref 6–23)
CALCIUM: 8.8 mg/dL (ref 8.4–10.5)
CO2: 27 meq/L (ref 19–32)
CREATININE: 1.61 mg/dL — AB (ref 0.50–1.35)
Chloride: 98 mEq/L (ref 96–112)
GFR calc Af Amer: 48 mL/min — ABNORMAL LOW (ref >90)
GFR calc non Af Amer: 41 mL/min — ABNORMAL LOW (ref >90)
Glucose, Bld: 131 mg/dL — ABNORMAL HIGH (ref 70–99)
Potassium: 3.7 mEq/L (ref 3.7–5.3)
Sodium: 137 mEq/L (ref 137–147)

## 2013-12-13 LAB — CBC
HEMATOCRIT: 30.8 % — AB (ref 39.0–52.0)
Hemoglobin: 10.2 g/dL — ABNORMAL LOW (ref 13.0–17.0)
MCH: 30.9 pg (ref 26.0–34.0)
MCHC: 33.1 g/dL (ref 30.0–36.0)
MCV: 93.3 fL (ref 78.0–100.0)
PLATELETS: 176 10*3/uL (ref 150–400)
RBC: 3.3 MIL/uL — ABNORMAL LOW (ref 4.22–5.81)
RDW: 14 % (ref 11.5–15.5)
WBC: 10.6 10*3/uL — ABNORMAL HIGH (ref 4.0–10.5)

## 2013-12-13 LAB — GLUCOSE, CAPILLARY
GLUCOSE-CAPILLARY: 118 mg/dL — AB (ref 70–99)
GLUCOSE-CAPILLARY: 129 mg/dL — AB (ref 70–99)
GLUCOSE-CAPILLARY: 136 mg/dL — AB (ref 70–99)
Glucose-Capillary: 127 mg/dL — ABNORMAL HIGH (ref 70–99)
Glucose-Capillary: 105 mg/dL — ABNORMAL HIGH (ref 70–99)

## 2013-12-13 MED ORDER — DIPHENHYDRAMINE HCL 25 MG PO CAPS
25.0000 mg | ORAL_CAPSULE | Freq: Every evening | ORAL | Status: DC | PRN
  Administered 2013-12-13: 25 mg via ORAL
  Filled 2013-12-13: qty 1

## 2013-12-13 MED ORDER — IPRATROPIUM-ALBUTEROL 0.5-2.5 (3) MG/3ML IN SOLN
3.0000 mL | Freq: Four times a day (QID) | RESPIRATORY_TRACT | Status: DC
  Administered 2013-12-13 – 2013-12-14 (×4): 3 mL via RESPIRATORY_TRACT
  Filled 2013-12-13 (×4): qty 3

## 2013-12-13 MED ORDER — INSULIN ASPART 100 UNIT/ML ~~LOC~~ SOLN
0.0000 [IU] | Freq: Three times a day (TID) | SUBCUTANEOUS | Status: DC
Start: 2013-12-13 — End: 2013-12-14
  Administered 2013-12-13 – 2013-12-14 (×2): 2 [IU] via SUBCUTANEOUS

## 2013-12-13 NOTE — Progress Notes (Signed)
ANTIBIOTIC CONSULT NOTE - FOLLOW UP  Pharmacy Consult for Zosyn Indication: pneumonia  No Known Allergies  Patient Measurements: Height: 6' (182.9 cm) Weight: 247 lb 2.2 oz (112.1 kg) IBW/kg (Calculated) : 77.6 Adjusted Body Weight:    Vital Signs: Temp: 99.7 F (37.6 C) (08/02 0300) Temp src: Oral (08/02 0300) BP: 117/50 mmHg (08/02 0300) Pulse Rate: 75 (08/02 0300) Intake/Output from previous day: 08/01 0701 - 08/02 0700 In: 940 [P.O.:840; IV Piggyback:100] Out: 1250 [Urine:1250] Intake/Output from this shift: Total I/O In: 240 [P.O.:240] Out: 400 [Urine:400]  Labs:  Recent Labs  12/12/13 0030 12/13/13 0340  WBC 12.4* 10.6*  HGB 11.4* 10.2*  PLT 205 176  CREATININE 1.54* 1.61*   Estimated Creatinine Clearance: 54.4 ml/min (by C-G formula based on Cr of 1.61). No results found for this basename: VANCOTROUGH, [name], VANCORANDOM, [place], GENTPEAK, GENTRANDOM, TOBRATROUGH, TOBRAPEAK, TOBRARND, AMIKACINPEAK, AMIKACINTROU, AMIKACIN,  in the last 72 hours   Microbiology: Recent Results (from the past 720 hour(s))  MRSA PCR SCREENING     Status: None   Collection Time    12/11/13 11:14 PM      Result Value Ref Range Status   MRSA by PCR NEGATIVE  NEGATIVE Final   Comment:            The GeneXpert MRSA Assay (FDA     approved for NASAL specimens     only), is one component of a     comprehensive MRSA colonization     surveillance program. It is not     intended to diagnose MRSA     infection nor to guide or     monitor treatment for     MRSA infections.    Anti-infectives   Start     Dose/Rate Route Frequency Ordered Stop   12/12/13 0400  piperacillin-tazobactam (ZOSYN) IVPB 3.375 g     3.375 g 12.5 mL/hr over 240 Minutes Intravenous Every 8 hours 12/12/13 0048        Assessment: SOB x 3d, vomiting 73 y/o M transferred from [facility] ER after being placed on BiPap and receiving a dose of Levaquin and possibly Cefepime.  Anticoagulation: Afib.  SQ heparin. Hgb 11.4>>10.2. Plts 205>>176. No anticoagulation PTA other than ASA.  Infectious Disease: Acute hypoxic respiratory failure, COPD, likely aspiration pneumonitis vs PNA. Tmax 100.2. WBC 12.4>>10.6 Zosyn 8/1>>  Cardiovascular: CHF, afib. Meds: po amio, Lipitor, bisoprolol  Endocrinology: DM on SSI. CBG 73-145  Gastrointestinal / Nutrition: Pepcid  Neurology: depression on Prozac, Gabapentin  Nephrology: CKD. Scr 1.54>>1.61. UOP 1559ml/24h  Pulmonary: OSA, Emphysema, h/o lung cancer. Bipap, alb nebx,   Hematology / Oncology: h/o lung, sigmoid colon cancer.  PTA Medication Issues  Best Practices   Plan:  Zosyn 3.375g IV q8hr. Pharmacy will sign off. No dosage adjustment required unless CrCl<30.    [name], PharmD, [facility] Clinical Staff Pharmacist Pager [phone]  [name] 12/13/2013,10:30 AM

## 2013-12-13 NOTE — Progress Notes (Addendum)
PULMONARY / CRITICAL CARE MEDICINE   Name: [name] Sr. MRN: [id number] DOB: February 21, 1941    ADMISSION DATE:  12/11/2013 CONSULTATION DATE:  12/11/2013   REFERRING MD :  Transfer from [place]:  Shortness of breath  INITIAL PRESENTATION: To [name] via EMS with acute hypoxic respiratory failure and emesis.  STUDIES:  8/1 - CXR - Bibasilar opacities  SIGNIFICANT EVENTS: 7/31 - Initiated BiPap   HISTORY OF PRESENT ILLNESS:  [name] is a 73 y/o man with a history of CHF, COPD, lung cancer s/p resection, DMII, a-fib on amiodarone, OSA on home CPAP, CKD who presented to the [facility] ER today via EMS with severe shortness of breath. He denies every having to be treated with BiPap for respiratory distress previously however he does wear CPAP for OSA.     Subjective Pt is doing better.    VITAL SIGNS: Temp:  [98.8 F (37.1 C)-100.2 F (37.9 C)] 99.7 F (37.6 C) (08/02 0300) Pulse Rate:  [68-75] 75 (08/02 0300) Resp:  [18-20] 19 (08/02 0300) BP: (105-147)/(50-60) 117/50 mmHg (08/02 0300) SpO2:  [90 %-96 %] 90 % (08/02 0300) Weight:  [109.4 kg (241 lb 2.9 oz)-112.1 kg (247 lb 2.2 oz)] 112.1 kg (247 lb 2.2 oz) (08/02 0300)   INTAKE / OUTPUT:  Intake/Output Summary (Last 24 hours) at 12/13/13 0925 Last data filed at 12/13/13 7893  Gross per 24 hour  Intake   1180 ml  Output   1650 ml  Net   -470 ml    PHYSICAL EXAMINATION: General:  Obese male laying in bed, on [place]  Speaking in full sentences  Neuro:  A&Ox3 HEENT:  PERRL, EOMI, OP clear Cardiovascular:  NRRR, no mrg  Lungs:  Diminished in right lung base, rhonchi in bilateral upper lobes Abdomen:  Obese, +BS, NTND, no appreciable HSM Musculoskeletal:  1+edema bilateral lower extremities Skin:  No bruises, rashes or other lesions  LABS: - pending  CBC  Recent Labs Lab 12/12/13 0030 12/13/13 0340  WBC 12.4* 10.6*  HGB 11.4* 10.2*  HCT 35.2* 30.8*  PLT 205 176   Coag's No results found  for this basename: APTT, INR,  in the last 168 hours BMET  Recent Labs Lab 12/12/13 0030 12/13/13 0340  NA 141 137  K 3.8 3.7  CL 100 98  CO2 24 27  BUN 27* 25*  CREATININE 1.54* 1.61*  GLUCOSE 165* 131*   Electrolytes  Recent Labs Lab 12/12/13 0030 12/13/13 0340  CALCIUM 8.8 8.8  MG 1.6  --   PHOS 3.4  --    Sepsis Markers  Recent Labs Lab 12/12/13 0030  LATICACIDVEN 2.1  PROCALCITON 10.37   ABG No results found for this basename: PHART, PCO2ART, PO2ART,  in the last 168 hours Liver Enzymes  Recent Labs Lab 12/12/13 0030  AST 15  ALT 17  ALKPHOS 96  BILITOT 0.5  ALBUMIN 3.5   Cardiac Enzymes  Recent Labs Lab 12/12/13 0048  PROBNP 2384.0*   Glucose  Recent Labs Lab 12/12/13 1631 12/12/13 2023 12/12/13 2110 12/13/13 12/13/13 0400 12/13/13 0728  GLUCAP 111* 145* 116* 122* 127* 105*    Imaging Dg Chest Port 1 View  12/12/2013   CLINICAL DATA:  Shortness of breath.  EXAM: PORTABLE CHEST - 1 VIEW  COMPARISON:  PET-CT 09/15/2013 and CT chest 09/08/2013.  FINDINGS: Shallow inspiration. Normal heart size and pulmonary vascularity. Focal consolidation or airspace disease in the right lung base suggests focal pneumonia. No blunting of  costophrenic angles. No pneumothorax. Surgical clips in the left upper lung.  IMPRESSION: Focal consolidation in the right lower lung suggesting pneumonia.   Electronically Signed   By: Lucienne Capers M.D.   On: 12/12/2013 00:23   CXR - no film 8/2  ASSESSMENT / PLAN: Principal Problem:   Aspiration pneumonia Active Problems:   COPD UNSPECIFIED   OBSTRUCTIVE SLEEP APNEA   Acute respiratory failure with hypoxia   PULMONARY A: Acute hypoxic respiratory failure, COPD, likely aspiration pneumonitis, RLL PNA on CXR. Improved 12/13/13  P:   Cont CPAP with sleep 2/2 OSA qhs Wean oxygen Will cont to treat with zosyn for possible aspiration pneumonia Continue LABA/ICS combo, albuterol/atrovent nebsq4    CARDIOVASCULAR A: CHF, a-fib P:  Continue home amiodarone Hold lasix  RENAL A:  Hx of CKD P:   Monitor UOP and Cr on daily BMP  GASTROINTESTINAL A:  Emesis, nausea P:   Zofran PRN   INFECTIOUS A:  Aspiration pneumonitis vs. CAP Urine strep ag neg P:   BCx2 pending from OSH UC pending Abx: Zosyn, start date 8/1, day 2/7  ENDOCRINE A:  DMII   P:   SSI    TODAY'S SUMMARY: Acute hypoxic respiratory failure likely secondary to aspiration vs RLL CAP. Stable on floor  I have personally obtained a history, examined the patient, evaluated laboratory and imaging results, formulated the assessment and plan and placed orders.   Mariel Sleet Beeper  [phone]  Cell  [phone]  If no response or cell goes to voicemail, call beeper [phone]]   12/13/2013, 9:25 AM

## 2013-12-13 NOTE — Progress Notes (Signed)
RT applied CPAP for patient on 11cmH2O per patient's home regimen. Patient is tolerating the CPAP at this time. RT will continue to monitor.

## 2013-12-13 NOTE — Progress Notes (Signed)
Utilization Review Completed.   Anaissa Macfadden, RN, BSN Nurse Case Manager  

## 2013-12-14 DIAGNOSIS — G4733 Obstructive sleep apnea (adult) (pediatric): Secondary | ICD-10-CM

## 2013-12-14 LAB — LEGIONELLA ANTIGEN, URINE: LEGIONELLA ANTIGEN, URINE: NEGATIVE

## 2013-12-14 LAB — HEMOGLOBIN A1C
Hgb A1c MFr Bld: 6.5 % — ABNORMAL HIGH (ref <5.7)
MEAN PLASMA GLUCOSE: 140 mg/dL — AB (ref <117)

## 2013-12-14 LAB — GLUCOSE, CAPILLARY
GLUCOSE-CAPILLARY: 120 mg/dL — AB (ref 70–99)
GLUCOSE-CAPILLARY: 125 mg/dL — AB (ref 70–99)

## 2013-12-14 MED ORDER — AMOXICILLIN-POT CLAVULANATE 875-125 MG PO TABS: 1.0000 | ORAL_TABLET | Freq: Two times a day (BID) | ORAL | Status: AC

## 2013-12-14 NOTE — Care Management Note (Signed)
    Page 1 of 1   12/14/2013     1:57:41 PM CARE MANAGEMENT NOTE 12/14/2013  Patient:  [name], [name]   Account Number:  [id number]  Date Initiated:  12/14/2013  Documentation initiated by:  [facility]  Subjective/Objective Assessment:   73 y/o man hx of CHF, COPD, lung cancer s/p resection, DMII, a-fib on amiodarone, OSA on home CPAP, CKD who presented to the [facility] ER today via EMS with severe SOB.//Home alone.     Action/Plan:   BiPap  VBG pending  Wean BiPap as able - will need CPAP with sleep 2/2 OSA  Will treat with zosyn for possible aspiration pneumonia.//Access for [facility]   Anticipated DC Date:  12/14/2013   Anticipated DC Plan:  [place]  CM consult      Choice offered to / List presented to:             Status of service:  Completed, signed off Medicare Important Message given?  YES (If response is "NO", the following Medicare IM given date fields will be blank) Date Medicare IM given:  12/14/2013 Medicare IM given by:  [facility] Date Additional Medicare IM given:   Additional Medicare IM given by:    Discharge Disposition:    Per UR Regulation:  Reviewed for med. necessity/level of care/duration of stay  If discussed at [place] of Stay Meetings, dates discussed:    Comments:

## 2013-12-14 NOTE — Progress Notes (Signed)
Patient and family given discharge instructions and all questions answered.   Pt discharged via wheelchair with all belongings.

## 2013-12-14 NOTE — Discharge Summary (Signed)
Physician Discharge Summary  Patient ID: Jonathan TEDESCO Sr. MRN: [id number] DOB/AGE: [birth year] 72 y.o.  Admit date: 12/11/2013 Discharge date: 12/14/2013    Discharge Diagnoses:  Acute hypoxemic respiratory failure Aspiration PNA COPD with exacerbation OSA A.Fib CHF CAD DM Depression                                                                     DISCHARGE PLAN BY DIAGNOSIS     Acute hypoxemic respiratory failure Aspiration PNA COPD with exacerbation OSA Discharge Plan: - Continue Advair, Albuterol, CPAP. - Continue Augmentin, 10 day course. - Please schedule a hospital follow up with Dr. Chancy Milroy in one week.  A.Fib CHF CAD Discharge Plan: - Continue outpatient meds. - F/u with PCP.  DM Discharge Plan: - Continue outpatient meds. - F/u with PCP.  Depression Discharge Plan: - Continue outpatient meds. - F/u with PCP.                DISCHARGE SUMMARY   [name]. is a 73 y.o. y/o male with a PMH of adenocarcinoma of lung s/p resection, COPD, OSA, CAD, HTN, dCHF, A.fib, OA, CKD, Depression.  He presented to [facility] ER on 12/11/13 via EMS with severe SOB x 3 days which got acutely worse after having a few episodes of emesis.  Upon EMS arrival, SpO2 was found to be 78%.  He was placed on a non re-breather and he requested transfer to [facility].  While at [facility], he was started on BiPAP therapy with significant improvement in his respiratory status.  Of note, pt wears CPAP nightly as an outpatient.  Blood cultures were drawn and he was started on empiric antibiotics. On 8/3, pt appeared and reported that he felt 100% better.  He was deemed safe for discharge home and was instructed to follow up with Dr. Chancy Milroy within one week.  He will be discharged on a 10 day course of Augmentin.         SIGNIFICANT DIAGNOSTIC STUDIES / EVENTS 7/31 BiPAP initiated 8/1 CXR >>> bibasilar opacities   MICRO DATA  Blood 7/31 ([facility]) >>>  ANTIBIOTICS Vanc x 1 Cefepime x  1 Zosyn 8/1 >>> 8/3 Augmentin 8/4 >>> stop date of 8/13  CONSULTS None  TUBES / LINES None   Discharge Exam: General: Obese male, resting in bed, in NAD, family present. Neuro: A&O x 3, mood and affect appropriate, MAE's. CV: RRR, no M/R/G. PULM: Occasional coarse rhonchi, diminished air entry. GI:  BS x 4, soft, NT/ND. Extremities: No gross deformities, no edema.  Filed Vitals:   12/14/13 0646 12/14/13 0650 12/14/13 0652 12/14/13 0910  BP:  127/52    Pulse:  82    Temp:  100.3 F (37.9 C)    TempSrc:  Oral    Resp:  20    Height:      Weight: 107.049 kg (236 lb)  107.049 kg (236 lb)   SpO2:  90%  95%     Discharge Labs  BMET  Recent Labs Lab 12/12/13 0030 12/13/13 0340  NA 141 137  K 3.8 3.7  CL 100 98  CO2 24 27  GLUCOSE 165* 131*  BUN 27* 25*  CREATININE 1.54* 1.61*  CALCIUM 8.8 8.8  MG  1.6  --   PHOS 3.4  --     CBC  Recent Labs Lab 12/12/13 0030 12/13/13 0340  HGB 11.4* 10.2*  HCT 35.2* 30.8*  WBC 12.4* 10.6*  PLT 205 176    Anti-Coagulation No results found for this basename: INR,  in the last 168 hours      Discharge Instructions   Call MD for:  difficulty breathing, headache or visual disturbances    Complete by:  As directed      Call MD for:  extreme fatigue    Complete by:  As directed      Call MD for:  persistant dizziness or light-headedness    Complete by:  As directed      Call MD for:  persistant nausea and vomiting    Complete by:  As directed      Call MD for:  temperature >100.4    Complete by:  As directed      Diet - low sodium heart healthy    Complete by:  As directed      Discharge instructions    Complete by:  As directed   Please make a hospital follow up appointment with Dr. Chancy Milroy in one week.     Increase activity slowly    Complete by:  As directed              Medication List         ADVAIR DISKUS 250-50 MCG/DOSE Aepb  Generic drug:  Fluticasone-Salmeterol  Inhale 1 puff into the lungs  every 12 (twelve) hours as needed (asthma).     amiodarone 200 MG tablet  Commonly known as:  PACERONE  Take 100 mg by mouth daily.     amoxicillin-clavulanate 875-125 MG per tablet  Commonly known as:  AUGMENTIN  Take 1 tablet by mouth 2 (two) times daily.     aspirin 81 MG tablet  Take 81 mg by mouth every other day.     atorvastatin 20 MG tablet  Commonly known as:  LIPITOR  Take 20 mg by mouth daily.     bisoprolol 10 MG tablet  Commonly known as:  ZEBETA  Take 10 mg by mouth 2 (two) times daily.     FLUoxetine 20 MG tablet  Commonly known as:  PROZAC  Take 60 mg by mouth daily.     furosemide 40 MG tablet  Commonly known as:  LASIX  Take 40 mg by mouth 2 (two) times daily.     gabapentin 300 MG capsule  Commonly known as:  NEURONTIN  Take 300 mg by mouth daily.     glimepiride 2 MG tablet  Commonly known as:  AMARYL  Take 2 mg by mouth daily with breakfast.     HYDROcodone-acetaminophen 5-325 MG per tablet  Commonly known as:  NORCO/VICODIN  Take 1 tablet by mouth every 6 (six) hours as needed (pain).     potassium chloride 10 MEQ tablet  Commonly known as:  KLOR-CON 10  Take 1 tablet (10 mEq total) by mouth daily.     VENTOLIN HFA 108 (90 BASE) MCG/ACT inhaler  Generic drug:  albuterol  Inhale 2 puffs into the lungs every 6 (six) hours as needed for wheezing or shortness of breath.          Disposition:  Home  Discharged Condition: Jonathan TOROK Sr. has met maximum benefit of inpatient care and is medically stable and cleared for discharge.  Patient is pending follow up  as above.      Time spent on disposition:  Greater than 45 minutes.   Montey Hora, PA - [facility] Pgr: [phone]  or [phone]   Chesley Mires, MD [facility] 12/14/2013, 1:56 PM Pager:  [phone] After 3pm call: [phone]

## 2013-12-22 ENCOUNTER — Other Ambulatory Visit: Payer: Medicare HMO

## 2013-12-22 ENCOUNTER — Ambulatory Visit: Payer: Medicare HMO | Admitting: Thoracic Surgery (Cardiothoracic Vascular Surgery)

## 2013-12-25 ENCOUNTER — Telehealth: Payer: Self-pay | Admitting: *Deleted

## 2013-12-25 NOTE — Telephone Encounter (Signed)
Spoke to patient's fiance on 8/13 and was told patient had been in [facility] for pneumonia and after being released two days later had a massive stroke and still in the hospital. Follow-up in October 2015 to check on patient for a CT scan follow-up.SB

## 2013-12-30 ENCOUNTER — Encounter: Payer: Self-pay | Admitting: Gastroenterology

## 2014-01-27 ENCOUNTER — Telehealth: Payer: Self-pay | Admitting: *Deleted

## 2014-10-29 ENCOUNTER — Other Ambulatory Visit: Payer: Self-pay | Admitting: Cardiology

## 2014-11-04 ENCOUNTER — Other Ambulatory Visit: Payer: Self-pay | Admitting: Cardiology

## 2014-11-05 ENCOUNTER — Other Ambulatory Visit: Payer: Self-pay

## 2014-11-05 MED ORDER — AMIODARONE HCL 200 MG PO TABS: 100.0000 mg | ORAL_TABLET | Freq: Every day | ORAL | Status: AC

## 2015-10-05 ENCOUNTER — Other Ambulatory Visit: Payer: Self-pay | Admitting: Cardiology

## 2016-02-20 ENCOUNTER — Encounter: Payer: Self-pay | Admitting: Gastroenterology

## 2017-04-13 DEATH — deceased
